# Patient Record
Sex: Male | Born: 1970 | Hispanic: Yes | Smoking: Former smoker
Health system: Southern US, Community
[De-identification: ages and names within clinical notes are randomized; demographics above are authoritative.]

## PROBLEM LIST (undated history)

## (undated) DIAGNOSIS — I10 Essential (primary) hypertension: Secondary | ICD-10-CM

## (undated) DIAGNOSIS — E119 Type 2 diabetes mellitus without complications: Secondary | ICD-10-CM

## (undated) HISTORY — PX: PEG TUBE REMOVAL: SHX2187

---

## 2013-06-14 DIAGNOSIS — J439 Emphysema, unspecified: Secondary | ICD-10-CM | POA: Insufficient documentation

## 2013-06-14 DIAGNOSIS — F17200 Nicotine dependence, unspecified, uncomplicated: Secondary | ICD-10-CM | POA: Insufficient documentation

## 2013-06-15 DIAGNOSIS — F1410 Cocaine abuse, uncomplicated: Secondary | ICD-10-CM | POA: Insufficient documentation

## 2013-07-29 DIAGNOSIS — R109 Unspecified abdominal pain: Secondary | ICD-10-CM | POA: Insufficient documentation

## 2015-03-29 HISTORY — PX: TRACHEOSTOMY: SUR1362

## 2015-03-29 HISTORY — PX: PEG PLACEMENT: SHX5437

## 2017-11-18 ENCOUNTER — Encounter (HOSPITAL_BASED_OUTPATIENT_CLINIC_OR_DEPARTMENT_OTHER): Payer: Self-pay

## 2017-11-18 ENCOUNTER — Other Ambulatory Visit: Payer: Self-pay

## 2017-11-18 ENCOUNTER — Emergency Department (HOSPITAL_BASED_OUTPATIENT_CLINIC_OR_DEPARTMENT_OTHER): Payer: Self-pay

## 2017-11-18 ENCOUNTER — Inpatient Hospital Stay (HOSPITAL_BASED_OUTPATIENT_CLINIC_OR_DEPARTMENT_OTHER)
Admission: EM | Admit: 2017-11-18 | Discharge: 2017-11-23 | DRG: 418 | Disposition: A | Discharge status: Home / Self Care | Payer: Self-pay | Attending: Internal Medicine | Admitting: Internal Medicine

## 2017-11-18 DIAGNOSIS — N183 Chronic kidney disease, stage 3 unspecified: Secondary | ICD-10-CM

## 2017-11-18 DIAGNOSIS — R112 Nausea with vomiting, unspecified: Secondary | ICD-10-CM

## 2017-11-18 DIAGNOSIS — E1121 Type 2 diabetes mellitus with diabetic nephropathy: Secondary | ICD-10-CM | POA: Diagnosis present

## 2017-11-18 DIAGNOSIS — K3184 Gastroparesis: Secondary | ICD-10-CM

## 2017-11-18 DIAGNOSIS — K5909 Other constipation: Secondary | ICD-10-CM | POA: Diagnosis present

## 2017-11-18 DIAGNOSIS — Z419 Encounter for procedure for purposes other than remedying health state, unspecified: Secondary | ICD-10-CM

## 2017-11-18 DIAGNOSIS — Z23 Encounter for immunization: Secondary | ICD-10-CM

## 2017-11-18 DIAGNOSIS — Z87891 Personal history of nicotine dependence: Secondary | ICD-10-CM

## 2017-11-18 DIAGNOSIS — Z79899 Other long term (current) drug therapy: Secondary | ICD-10-CM

## 2017-11-18 DIAGNOSIS — K920 Hematemesis: Secondary | ICD-10-CM | POA: Diagnosis present

## 2017-11-18 DIAGNOSIS — E1122 Type 2 diabetes mellitus with diabetic chronic kidney disease: Secondary | ICD-10-CM | POA: Diagnosis present

## 2017-11-18 DIAGNOSIS — N179 Acute kidney failure, unspecified: Secondary | ICD-10-CM | POA: Diagnosis present

## 2017-11-18 DIAGNOSIS — I10 Essential (primary) hypertension: Secondary | ICD-10-CM

## 2017-11-18 DIAGNOSIS — Z789 Other specified health status: Secondary | ICD-10-CM

## 2017-11-18 DIAGNOSIS — E1129 Type 2 diabetes mellitus with other diabetic kidney complication: Secondary | ICD-10-CM | POA: Insufficient documentation

## 2017-11-18 DIAGNOSIS — Z91013 Allergy to seafood: Secondary | ICD-10-CM

## 2017-11-18 DIAGNOSIS — D649 Anemia, unspecified: Secondary | ICD-10-CM | POA: Diagnosis present

## 2017-11-18 DIAGNOSIS — I129 Hypertensive chronic kidney disease with stage 1 through stage 4 chronic kidney disease, or unspecified chronic kidney disease: Secondary | ICD-10-CM | POA: Diagnosis present

## 2017-11-18 DIAGNOSIS — K811 Chronic cholecystitis: Principal | ICD-10-CM | POA: Diagnosis present

## 2017-11-18 DIAGNOSIS — E871 Hypo-osmolality and hyponatremia: Secondary | ICD-10-CM | POA: Diagnosis not present

## 2017-11-18 DIAGNOSIS — N200 Calculus of kidney: Secondary | ICD-10-CM | POA: Diagnosis present

## 2017-11-18 DIAGNOSIS — E1165 Type 2 diabetes mellitus with hyperglycemia: Secondary | ICD-10-CM | POA: Insufficient documentation

## 2017-11-18 DIAGNOSIS — K828 Other specified diseases of gallbladder: Secondary | ICD-10-CM | POA: Diagnosis present

## 2017-11-18 DIAGNOSIS — Z794 Long term (current) use of insulin: Secondary | ICD-10-CM

## 2017-11-18 DIAGNOSIS — E860 Dehydration: Secondary | ICD-10-CM | POA: Diagnosis present

## 2017-11-18 DIAGNOSIS — E1140 Type 2 diabetes mellitus with diabetic neuropathy, unspecified: Secondary | ICD-10-CM | POA: Diagnosis present

## 2017-11-18 DIAGNOSIS — K4090 Unilateral inguinal hernia, without obstruction or gangrene, not specified as recurrent: Secondary | ICD-10-CM | POA: Diagnosis present

## 2017-11-18 HISTORY — DX: Type 2 diabetes mellitus without complications: E11.9

## 2017-11-18 HISTORY — DX: Essential (primary) hypertension: I10

## 2017-11-18 LAB — COMPREHENSIVE METABOLIC PANEL
ALK PHOS: 51 U/L (ref 38–126)
ALT: 16 U/L (ref 0–44)
AST: 22 U/L (ref 15–41)
Albumin: 4.1 g/dL (ref 3.5–5.0)
Anion gap: 13 (ref 5–15)
BUN: 33 mg/dL — AB (ref 6–20)
CALCIUM: 9.1 mg/dL (ref 8.9–10.3)
CHLORIDE: 99 mmol/L (ref 98–111)
CO2: 24 mmol/L (ref 22–32)
CREATININE: 2.41 mg/dL — AB (ref 0.61–1.24)
GFR calc Af Amer: 35 mL/min — ABNORMAL LOW (ref >60)
GFR calc non Af Amer: 31 mL/min — ABNORMAL LOW (ref >60)
GLUCOSE: 289 mg/dL — AB (ref 70–99)
Potassium: 4 mmol/L (ref 3.5–5.1)
SODIUM: 136 mmol/L (ref 135–145)
Total Bilirubin: 1.1 mg/dL (ref 0.3–1.2)
Total Protein: 7.2 g/dL (ref 6.5–8.1)

## 2017-11-18 LAB — CBG MONITORING, ED: Glucose-Capillary: 294 mg/dL — ABNORMAL HIGH (ref 70–99)

## 2017-11-18 LAB — CBC
HEMATOCRIT: 35.6 % — AB (ref 39.0–52.0)
Hemoglobin: 11.8 g/dL — ABNORMAL LOW (ref 13.0–17.0)
MCH: 29.5 pg (ref 26.0–34.0)
MCHC: 33.1 g/dL (ref 30.0–36.0)
MCV: 89 fL (ref 78.0–100.0)
Platelets: 236 10*3/uL (ref 150–400)
RBC: 4 MIL/uL — ABNORMAL LOW (ref 4.22–5.81)
RDW: 13.6 % (ref 11.5–15.5)
WBC: 8 10*3/uL (ref 4.0–10.5)

## 2017-11-18 LAB — GLUCOSE, CAPILLARY
GLUCOSE-CAPILLARY: 266 mg/dL — AB (ref 70–99)
Glucose-Capillary: 168 mg/dL — ABNORMAL HIGH (ref 70–99)

## 2017-11-18 LAB — CBC WITH DIFFERENTIAL/PLATELET
BASOS ABS: 0.1 10*3/uL (ref 0.0–0.1)
Basophils Relative: 1 %
EOS ABS: 0.2 10*3/uL (ref 0.0–0.7)
Eosinophils Relative: 3 %
HCT: 39.2 % (ref 39.0–52.0)
HEMOGLOBIN: 13.3 g/dL (ref 13.0–17.0)
LYMPHS PCT: 27 %
Lymphs Abs: 2 10*3/uL (ref 0.7–4.0)
MCH: 29.6 pg (ref 26.0–34.0)
MCHC: 33.9 g/dL (ref 30.0–36.0)
MCV: 87.1 fL (ref 78.0–100.0)
Monocytes Absolute: 0.7 10*3/uL (ref 0.1–1.0)
Monocytes Relative: 9 %
NEUTROS PCT: 60 %
Neutro Abs: 4.4 10*3/uL (ref 1.7–7.7)
PLATELETS: 228 10*3/uL (ref 150–400)
RBC: 4.5 MIL/uL (ref 4.22–5.81)
RDW: 13.3 % (ref 11.5–15.5)
WBC: 7.2 10*3/uL (ref 4.0–10.5)

## 2017-11-18 LAB — TROPONIN I: Troponin I: <0.03 ng/mL (ref <0.03)

## 2017-11-18 LAB — LIPASE, BLOOD: Lipase: 48 U/L (ref 11–51)

## 2017-11-18 LAB — PROTIME-INR
INR: 1.15
Prothrombin Time: 14.6 seconds (ref 11.4–15.2)

## 2017-11-18 MED ORDER — ACETAMINOPHEN 650 MG RE SUPP: 650.0000 mg | Freq: Four times a day (QID) | RECTAL | Status: DC | PRN

## 2017-11-18 MED ORDER — FAMOTIDINE IN NACL 20-0.9 MG/50ML-% IV SOLN
20.0000 mg | Freq: Once | INTRAVENOUS | Status: AC
  Administered 2017-11-18: 20 mg via INTRAVENOUS
  Filled 2017-11-18: qty 50

## 2017-11-18 MED ORDER — LABETALOL HCL 5 MG/ML IV SOLN
10.0000 mg | Freq: Once | INTRAVENOUS | Status: AC
  Administered 2017-11-18: 10 mg via INTRAVENOUS
  Filled 2017-11-18: qty 4

## 2017-11-18 MED ORDER — ACETAMINOPHEN 325 MG PO TABS
650.0000 mg | ORAL_TABLET | Freq: Four times a day (QID) | ORAL | Status: DC | PRN
  Administered 2017-11-20 – 2017-11-22 (×4): 650 mg via ORAL
  Filled 2017-11-18 (×4): qty 2

## 2017-11-18 MED ORDER — METOCLOPRAMIDE HCL 5 MG/ML IJ SOLN
10.0000 mg | Freq: Once | INTRAMUSCULAR | Status: AC
  Administered 2017-11-18: 10 mg via INTRAVENOUS
  Filled 2017-11-18: qty 2

## 2017-11-18 MED ORDER — SODIUM CHLORIDE 0.9 % IV BOLUS
1000.0000 mL | Freq: Once | INTRAVENOUS | Status: AC
  Administered 2017-11-18: 1000 mL via INTRAVENOUS

## 2017-11-18 MED ORDER — ONDANSETRON HCL 4 MG PO TABS
4.0000 mg | ORAL_TABLET | Freq: Four times a day (QID) | ORAL | Status: DC
  Administered 2017-11-21 – 2017-11-23 (×5): 4 mg via ORAL
  Filled 2017-11-18 (×7): qty 1

## 2017-11-18 MED ORDER — PNEUMOCOCCAL VAC POLYVALENT 25 MCG/0.5ML IJ INJ
0.5000 mL | INJECTION | INTRAMUSCULAR | Status: AC
  Administered 2017-11-19: 0.5 mL via INTRAMUSCULAR
  Filled 2017-11-18: qty 0.5

## 2017-11-18 MED ORDER — INSULIN GLARGINE 100 UNIT/ML ~~LOC~~ SOLN
8.0000 [IU] | Freq: Every day | SUBCUTANEOUS | Status: DC
  Administered 2017-11-18 – 2017-11-21 (×4): 8 [IU] via SUBCUTANEOUS
  Filled 2017-11-18 (×5): qty 0.08

## 2017-11-18 MED ORDER — PROMETHAZINE HCL 25 MG/ML IJ SOLN
25.0000 mg | Freq: Once | INTRAMUSCULAR | Status: AC
  Administered 2017-11-18: 25 mg via INTRAVENOUS
  Filled 2017-11-18: qty 1

## 2017-11-18 MED ORDER — LACTATED RINGERS IV SOLN
INTRAVENOUS | Status: DC
  Administered 2017-11-18 (×2): via INTRAVENOUS

## 2017-11-18 MED ORDER — ONDANSETRON HCL 4 MG/2ML IJ SOLN: 4.0000 mg | Freq: Four times a day (QID) | INTRAMUSCULAR | Status: DC | PRN

## 2017-11-18 MED ORDER — HALOPERIDOL LACTATE 5 MG/ML IJ SOLN: 2.5000 mg | Freq: Once | INTRAMUSCULAR | Status: DC

## 2017-11-18 MED ORDER — DIPHENHYDRAMINE HCL 50 MG/ML IJ SOLN
25.0000 mg | Freq: Once | INTRAMUSCULAR | Status: AC
  Administered 2017-11-18: 25 mg via INTRAVENOUS
  Filled 2017-11-18: qty 1

## 2017-11-18 MED ORDER — HALOPERIDOL LACTATE 5 MG/ML IJ SOLN: 5.0000 mg | Freq: Once | INTRAMUSCULAR | Status: DC

## 2017-11-18 MED ORDER — INSULIN ASPART 100 UNIT/ML ~~LOC~~ SOLN
0.0000 [IU] | Freq: Every day | SUBCUTANEOUS | Status: DC
  Administered 2017-11-21 – 2017-11-22 (×2): 3 [IU] via SUBCUTANEOUS

## 2017-11-18 MED ORDER — SODIUM CHLORIDE 0.9 % IV SOLN
INTRAVENOUS | Status: DC
  Administered 2017-11-18 – 2017-11-20 (×7): via INTRAVENOUS

## 2017-11-18 MED ORDER — HYDROMORPHONE HCL 1 MG/ML IJ SOLN
1.0000 mg | Freq: Once | INTRAMUSCULAR | Status: AC
  Administered 2017-11-18: 1 mg via INTRAVENOUS
  Filled 2017-11-18: qty 1

## 2017-11-18 MED ORDER — ONDANSETRON HCL 4 MG/2ML IJ SOLN
4.0000 mg | Freq: Once | INTRAMUSCULAR | Status: AC
  Administered 2017-11-18: 4 mg via INTRAVENOUS
  Filled 2017-11-18: qty 2

## 2017-11-18 MED ORDER — INSULIN ASPART 100 UNIT/ML ~~LOC~~ SOLN
0.0000 [IU] | Freq: Three times a day (TID) | SUBCUTANEOUS | Status: DC
  Administered 2017-11-18: 5 [IU] via SUBCUTANEOUS
  Administered 2017-11-19: 2 [IU] via SUBCUTANEOUS
  Administered 2017-11-19: 1 [IU] via SUBCUTANEOUS
  Administered 2017-11-20: 2 [IU] via SUBCUTANEOUS
  Administered 2017-11-20 – 2017-11-21 (×2): 1 [IU] via SUBCUTANEOUS
  Administered 2017-11-21: 3 [IU] via SUBCUTANEOUS
  Administered 2017-11-22: 2 [IU] via SUBCUTANEOUS
  Administered 2017-11-22: 1 [IU] via SUBCUTANEOUS
  Administered 2017-11-23: 3 [IU] via SUBCUTANEOUS

## 2017-11-18 MED ORDER — ONDANSETRON HCL 4 MG/2ML IJ SOLN
4.0000 mg | Freq: Four times a day (QID) | INTRAMUSCULAR | Status: DC
  Administered 2017-11-18 – 2017-11-22 (×11): 4 mg via INTRAVENOUS
  Filled 2017-11-18 (×13): qty 2

## 2017-11-18 MED ORDER — METOCLOPRAMIDE HCL 5 MG/ML IJ SOLN
10.0000 mg | Freq: Four times a day (QID) | INTRAMUSCULAR | Status: DC
  Administered 2017-11-18 – 2017-11-23 (×17): 10 mg via INTRAVENOUS
  Filled 2017-11-18 (×18): qty 2

## 2017-11-18 MED ORDER — MORPHINE SULFATE (PF) 2 MG/ML IV SOLN
2.0000 mg | INTRAVENOUS | Status: DC | PRN
  Administered 2017-11-19 – 2017-11-22 (×13): 2 mg via INTRAVENOUS
  Filled 2017-11-18 (×13): qty 1

## 2017-11-18 MED ORDER — PANTOPRAZOLE SODIUM 40 MG IV SOLR
40.0000 mg | Freq: Two times a day (BID) | INTRAVENOUS | Status: DC
  Administered 2017-11-18 – 2017-11-23 (×11): 40 mg via INTRAVENOUS
  Filled 2017-11-18 (×13): qty 40

## 2017-11-18 MED ORDER — LABETALOL HCL 5 MG/ML IV SOLN
10.0000 mg | INTRAVENOUS | Status: DC | PRN
  Administered 2017-11-19: 10 mg via INTRAVENOUS
  Filled 2017-11-18 (×2): qty 4

## 2017-11-18 MED ORDER — ONDANSETRON HCL 4 MG PO TABS: 4.0000 mg | ORAL_TABLET | Freq: Four times a day (QID) | ORAL | Status: DC | PRN

## 2017-11-18 MED ORDER — HALOPERIDOL LACTATE 5 MG/ML IJ SOLN
2.5000 mg | Freq: Once | INTRAMUSCULAR | Status: AC
  Administered 2017-11-18: 2.5 mg via INTRAVENOUS
  Filled 2017-11-18: qty 1

## 2017-11-18 NOTE — H&P (Addendum)
History and Physical    Thomas SlocumbWellington Haley  ZOX:[id number]  DOB: 11-24-1970  DOA: 11/18/2017 PCP: Patient, No Pcp Per   Patient coming from: home  Chief Complaint: vomiting  HPI: Thomas SlocumbWellington Blasdel is a 47 y.o. male with medical history of DM for the past 5 yrs, HTN who presents to the ED for vomiting and abdominal pain. 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  He was admitted for vomiting and abdominal pain at [facility] overnight and discharged yesterday. He states he was told his vomiting may be from his diabetes and was discharged with some medications that he has not yet had a chance to pick up. He has had ongoing vomiting and comes back to the ED. He states that there is a large amount of blood in his vomitus as well. He last vomited around 8 AM. He has pain in his mid abdomen which is a 7/10 right now and does not radiate. Pain medictaions make it better. He has not had any diarrhea. Stool has been brown and not bloody. No fever or chills. He states that he has had these symptoms for about 1 wk now and prior to this, he has never had any GI symptoms or weight loss. He does not drink alcohol.  He takes his insulin apprpriately and his diabetes "numbers" were about 7.5 when checked 3 months ago.   ED Course: given Reglan, Haldol, Labetalol, Dilaudid, Zofran, Pepcid and Phenergan. Also given NS boluses. Symptoms did not improve and he was referred for admission.   Review of Systems:  Admits to pain like "cramping" in his feet almost all the time. All other systems reviewed and apart from HPI, are negative.  Past Medical History:  Diagnosis Date  . Diabetes mellitus without complication (HCC)   . Hypertension     History reviewed. No pertinent surgical history.  Social History:  He does not admit to any tobacco, alcohol or drug use  Allergies  Allergen Reactions  . Shellfish Allergy    Family history- Grandfather  and children have Diabetes   Prior to Admission medications   Not on File    Physical Exam: Wt Readings from Last 3 Encounters:  No data found for Wt   Vitals:   11/18/17 1106 11/18/17 1107 11/18/17 1130 11/18/17 1238  BP: (!) 156/93  (!) 173/83 (!) 151/97  Pulse:  (!) 105 (!) 105 (!) 106  Resp:   18 16  Temp:    99.2 F (37.3 C)  TempSrc:    Oral  SpO2:  95% 97% 100%      Constitutional:  Calm & comfortable Eyes: PERRLA, lids and conjunctivae normal ENT:  Mucous membranes are moist.  Pharynx clear of exudate   Normal dentition.  Neck: Supple, no masses  Respiratory:  Clear to auscultation bilaterally  Normal respiratory effort.  Cardiovascular:  S1 & S2 heard, regular rate and rhythm No Murmurs Abdomen:  Non distended Tenderness in mid abdomen, No masses Bowel sounds normal Extremities:  No clubbing / cyanosis No pedal edema No joint deformity    Skin:  No rashes, lesions or ulcers Neurologic:  AAO x 3 CN 2-12 grossly intact Sensation intact Strength 5/5 in all 4 extremities Psychiatric:  Normal Mood and affect    Labs on Admission: I have personally reviewed following labs and imaging studies  CBC: Recent Labs  Lab 11/18/17 0553  WBC 7.2  NEUTROABS 4.4  HGB 13.3  HCT 39.2  MCV 87.1  PLT 228   Basic Metabolic Panel: Recent Labs  Lab 11/18/17 0553  NA 136  K 4.0  CL 99  CO2 24  GLUCOSE 289*  BUN 33*  CREATININE 2.41*  CALCIUM 9.1   GFR: CrCl cannot be calculated (Unknown ideal weight.). Liver Function Tests: Recent Labs  Lab 11/18/17 0553  AST 22  ALT 16  ALKPHOS 51  BILITOT 1.1  PROT 7.2  ALBUMIN 4.1   Recent Labs  Lab 11/18/17 0553  LIPASE 48   No results for input(s): AMMONIA in the last 168 hours. Coagulation Profile: Recent Labs  Lab 11/18/17 0553  INR 1.15   Cardiac Enzymes: Recent Labs  Lab 11/18/17 0553  TROPONINI <0.03   BNP (last 3 results) No results for input(s): PROBNP in the last 8760  hours. HbA1C: No results for input(s): HGBA1C in the last 72 hours. CBG: Recent Labs  Lab 11/18/17 0550  GLUCAP 294*   Lipid Profile: No results for input(s): CHOL, HDL, LDLCALC, TRIG, CHOLHDL, LDLDIRECT in the last 72 hours. Thyroid Function Tests: No results for input(s): TSH, T4TOTAL, FREET4, T3FREE, THYROIDAB in the last 72 hours. Anemia Panel: No results for input(s): VITAMINB12, FOLATE, FERRITIN, TIBC, IRON, RETICCTPCT in the last 72 hours. Urine analysis: No results found for: COLORURINE, APPEARANCEUR, LABSPEC, PHURINE, GLUCOSEU, HGBUR, BILIRUBINUR, KETONESUR, PROTEINUR, UROBILINOGEN, NITRITE, LEUKOCYTESUR Sepsis Labs: @LABRCNTIP (procalcitonin:4,lacticidven:4) )No results found for this or any previous visit (from the past 240 hour(s)).   Radiological Exams on Admission: Ct Abdomen Pelvis Wo Contrast  Result Date: 11/18/2017 CLINICAL DATA:  Acute epigastric abdominal pain. EXAM: CT ABDOMEN AND PELVIS WITHOUT CONTRAST TECHNIQUE: Multidetector CT imaging of the abdomen and pelvis was performed following the standard protocol without IV contrast. COMPARISON:  None. FINDINGS: Lower chest: No acute abnormality. Hepatobiliary: No focal liver abnormality is seen. No gallstones, gallbladder wall thickening, or biliary dilatation. Pancreas: Unremarkable. No pancreatic ductal dilatation or surrounding inflammatory changes. Spleen: Normal in size without focal abnormality. Adrenals/Urinary Tract: Adrenal glands appear normal. Bilateral nonobstructive nephrolithiasis is noted. Largest calculus measures 7 mm in lower pole collecting system of right kidney. No hydronephrosis or renal obstruction is noted. Mild urinary bladder distention is noted. Stomach/Bowel: Stomach is within normal limits. Appendix appears normal. No evidence of bowel wall thickening, distention, or inflammatory changes. Diverticulosis of descending and sigmoid colon is noted without inflammation. Vascular/Lymphatic: No  significant vascular findings are present. No enlarged abdominal or pelvic lymph nodes. Reproductive: Mild prostatic enlargement is noted. Other: No abdominal wall hernia or abnormality. No abdominopelvic ascites. Musculoskeletal: No acute or significant osseous findings. IMPRESSION: Bilateral nonobstructive nephrolithiasis. No hydronephrosis or renal obstruction is noted. Diverticulosis of descending and sigmoid colon is noted without inflammation. Mild prostatic enlargement. Electronically Signed   By: Lupita Raider, M.D.   On: 11/18/2017 08:59   Dg Abdomen Acute W/chest  Result Date: 11/18/2017 CLINICAL DATA:  Abdominal pain, nausea and vomiting. Shortness of breath. EXAM: DG ABDOMEN ACUTE W/ 1V CHEST COMPARISON:  None. FINDINGS: The cardiomediastinal contours are normal. The lungs are hyperinflated but clear. There is no free intra-abdominal air. No dilated bowel loops to suggest obstruction. Slight increased air throughout normal caliber small bowel in the right abdomen. Small volume of stool throughout the colon. No radiopaque calculi. No acute osseous abnormalities are seen. IMPRESSION: 1. Slight increased air within normal caliber small bowel loops in the right abdomen may represent enteritis or ileus. No obstruction or free air. 2. Hyperinflated but clear lungs. Electronically Signed  By: Rubye Oaks M.D.   On: 11/18/2017 06:48    EKG: Independently reviewed. Sinus rhythm at 109 bpm  Assessment/Plan Principal Problem:   Hematemesis / abdominal pain - he states his symptoms started about 1 wk ago - upon reviewing notes from [facility], he had a CT scan on 8/22  without contrast which did reveal nephrolithiasis but no obstruction and no other issues.  - he underwent a repeat CT today without contrast which does not reveal any etiology for his symptoms - will ask for GI eval due to his complaint of hemetemesis- It appears they may have consulted GI at [facility] but I  do not see an EGD report. - start Protonix and antiemetics - if he did have an A1c which was 7, I should not expect him to have gastroparesis but will repeat A1c and consider gastroparesis in his differential - NPO except ice chips and necessary medications - cont IVF - follow Hb- currently not anemic  Active Problems:   DM (diabetes mellitus), type 2 with renal complications - he tells me he takes 15 U of Lantus QHS and 5 U of Novolog TID/AC - resume Lantus at lower dose and add Novolog SSI - check A1c- he believes his last one was around 7 - ? If pain in his feet is neuropathy- follow  CKD 3? - labs from [facility] show a similar Cr level- follow Cr   Nephrolithiasis without obstruction    HTN (hypertension)  - will use Labetalol for now   DVT prophylaxis: SCDs Code Status: Full code  Family Communication:   Disposition Plan: admit to med/surg  Consults called: GI  Admission status: inpatient    Calvert Cantor MD [facility] Pager: [email or web address] Password [email or web address] 7PM-7AM, please contact night-coverage   11/18/2017, 1:29 PM

## 2017-11-18 NOTE — ED Notes (Signed)
Pt ambulated to restroom. Requesting to speak with MD. Will make MD aware.

## 2017-11-18 NOTE — ED Notes (Signed)
Patient transported to X-ray 

## 2017-11-18 NOTE — ED Notes (Signed)
Pt sleeping at this time. NAD noted. Awaiting disposition.

## 2017-11-18 NOTE — ED Notes (Signed)
Pt dry heaving  

## 2017-11-18 NOTE — ED Notes (Signed)
Pt given PO fluids.

## 2017-11-18 NOTE — Progress Notes (Signed)
Pt. arrived to [facility] from [facility]. Alert and oriented x 4. No respiratory distress noted.

## 2017-11-18 NOTE — ED Notes (Signed)
MD at bedside. 

## 2017-11-18 NOTE — ED Provider Notes (Addendum)
[facility] EMERGENCY DEPARTMENT Provider Note   CSN: [id number] Arrival date & time: 11/18/17  0540     History   Chief Complaint Chief Complaint  Patient presents with  . Emesis    HPI Thomas Haley is a 47 y.o. male.  HPI   47 yo M with reported h/o Dm here with abdominal pain. Pt in distress, yelling on arrival but per his report w/ interpreter, he has a h/o Dm and recurrent abd pain. He states that he's been told his pain is from "his diabetes." He states that for 2-3 days he's had severe epigastric pain that is aching, gnawing, and worse with eating. He's had associated vomiting and nausea. Unable to eat or drink. Denies any alcohol or other drug use, particularly marijuana. No fevers. No diarrhea or constipation. His current pain feels similar to his recurrent pain. He does not follow regularly with a GI doctor.  Past Medical History:  Diagnosis Date  . Hypertension     DM, type 1 HTN UGIB at [facility] per records   There are no active problems to display for this patient.  No relevant abdominal surgeries        Home Medications    Prior to Admission medications   Not on File    Family History History reviewed. No pertinent family history.  Social History Social History   Tobacco Use  . Smoking status: Not on file  Substance Use Topics  . Alcohol use: Not on file  . Drug use: Not on file     Allergies   Shellfish allergy   Review of Systems Review of Systems  Constitutional: Positive for fatigue. Negative for chills and fever.  HENT: Negative for congestion and rhinorrhea.   Eyes: Negative for visual disturbance.  Respiratory: Negative for cough, shortness of breath and wheezing.   Cardiovascular: Negative for chest pain and leg swelling.  Gastrointestinal: Positive for abdominal pain, nausea and vomiting. Negative for diarrhea.  Genitourinary: Negative for dysuria and flank pain.  Musculoskeletal: Negative for neck pain  and neck stiffness.  Skin: Negative for rash and wound.  Allergic/Immunologic: Negative for immunocompromised state.  Neurological: Positive for weakness. Negative for syncope and headaches.  All other systems reviewed and are negative.    Physical Exam Updated Vital Signs BP (!) 188/120 (BP Location: Right Arm)   Pulse (!) 103   Temp 98.1 F (36.7 C) (Oral)   Resp 19   SpO2 98%   Physical Exam  Constitutional: He is oriented to person, place, and time. He appears well-developed and well-nourished. He appears distressed.  HENT:  Head: Normocephalic and atraumatic.  Dry MM  Eyes: Conjunctivae are normal.  Neck: Neck supple.  Cardiovascular: Regular rhythm and normal heart sounds. Tachycardia present. Exam reveals no friction rub.  No murmur heard. Pulmonary/Chest: Effort normal and breath sounds normal. No respiratory distress. He has no wheezes. He has no rales.  Abdominal: Soft. He exhibits no distension. There is tenderness (epigastric). There is guarding. There is no rebound.  Musculoskeletal: He exhibits no edema.  Neurological: He is alert and oriented to person, place, and time. He exhibits normal muscle tone.  Skin: Skin is warm. Capillary refill takes less than 2 seconds. He is diaphoretic.  Psychiatric: He has a normal mood and affect.  Nursing note and vitals reviewed.    ED Treatments / Results  Labs (all labs ordered are listed, but only abnormal results are displayed) Labs Reviewed  COMPREHENSIVE METABOLIC PANEL - Abnormal; Notable  for the following components:      Result Value   Glucose, Bld 289 (*)    BUN 33 (*)    Creatinine, Ser 2.41 (*)    GFR calc non Af Amer 31 (*)    GFR calc Af Amer 35 (*)    All other components within normal limits  CBG MONITORING, ED - Abnormal; Notable for the following components:   Glucose-Capillary 294 (*)    All other components within normal limits  CBC WITH DIFFERENTIAL/PLATELET  LIPASE, BLOOD  TROPONIN I    PROTIME-INR    EKG EKG Interpretation  Date/Time:  Saturday November 18 2017 06:08:59 EDT Ventricular Rate:  109 PR Interval:    QRS Duration: 57 QT Interval:  334 QTC Calculation: 450 R Axis:   83 Text Interpretation:  Sinus tachycardia Artifact in lead(s) I II III aVR aVL aVF V1 V2 V3 V4 V5 V6 No old tracing to compare Confirmed by Shaune PollackIsaacs, Kedrick Mcnamee [phone]([id number]) on 11/18/2017 6:17:21 AM   Radiology Dg Abdomen Acute W/chest  Result Date: 11/18/2017 CLINICAL DATA:  Abdominal pain, nausea and vomiting. Shortness of breath. EXAM: DG ABDOMEN ACUTE W/ 1V CHEST COMPARISON:  None. FINDINGS: The cardiomediastinal contours are normal. The lungs are hyperinflated but clear. There is no free intra-abdominal air. No dilated bowel loops to suggest obstruction. Slight increased air throughout normal caliber small bowel in the right abdomen. Small volume of stool throughout the colon. No radiopaque calculi. No acute osseous abnormalities are seen. IMPRESSION: 1. Slight increased air within normal caliber small bowel loops in the right abdomen may represent enteritis or ileus. No obstruction or free air. 2. Hyperinflated but clear lungs. Electronically Signed   By: Rubye OaksMelanie  Ehinger M.D.   On: 11/18/2017 06:48    Procedures Procedures (including critical care time)  Medications Ordered in ED Medications  sodium chloride 0.9 % bolus 1,000 mL (1,000 mLs Intravenous New Bag/Given 11/18/17 0554)  famotidine (PEPCID) IVPB 20 mg premix (20 mg Intravenous New Bag/Given 11/18/17 0630)  sodium chloride 0.9 % bolus 1,000 mL (1,000 mLs Intravenous New Bag/Given 11/18/17 0639)  labetalol (NORMODYNE,TRANDATE) injection 10 mg (has no administration in time range)  haloperidol lactate (HALDOL) injection 2.5 mg (2.5 mg Intravenous Given 11/18/17 0552)  diphenhydrAMINE (BENADRYL) injection 25 mg (25 mg Intravenous Given 11/18/17 0552)  metoCLOPramide (REGLAN) injection 10 mg (10 mg Intravenous Given 11/18/17 [id number])     Initial  Impression / Assessment and Plan / ED Course  I have reviewed the triage vital signs and the nursing notes.  Pertinent labs & imaging results that were available during my care of the patient were reviewed by me and considered in my medical decision making (see chart for details).     47 yo M with history of T1DM here with severe nausea, vomiting, and abdominal pain. Pt was reportedly just hospitalized at [facility] for similar sx, and pt endorses h/o same which is suspicious for diabetic gastroparesis. Denies MJ use. Will give antiemetics, fluids, and check labs. AAS ordered for eval of obstruction, perforation.  Labs reviewed. Hgb 13.3, which is reassuring given reported h/o UGIB at [facility]. WBC normal. CMP shows Cr 2.41. BUN elevated at 33, so I suspect there is a component of pre-renal AKI though baseline is unknown - will follow-up with [facility]. Lipase wnl. EKG w/ artifact but no ischemia, QTc 450. Will continue fluids, sx control. Pt is supposed to be on metoprolol as well and has been vomiting - will give dose of labetalol here.  AAS w/ enteritis/ileitis. Given degree of pain on serial exam, will check CT. Current plan is to continue fluids, sx control, and f/u [facility] lab work. If Cr is near baseline, and pt improved, would be reasonable to continue outpt care. If Cr elevated, persistently vomiting, CT is abnormal, or other concerning progression, dispo accordingly.   Patient care transferred to Dr. Hedwig Morton at the end of my shift. Patient presentation, ED course, and plan of care discussed with review of all pertinent labs and imaging. Please see his/her note for further details regarding further ED course and disposition.  Final Clinical Impressions(s) / ED Diagnoses   Final diagnoses:  Gastroparesis  Nausea and vomiting, intractability of vomiting not specified, unspecified vomiting type    ED Discharge Orders    None       Shaune Pollack, MD 11/18/17 1610    Shaune Pollack,  MD 11/18/17 [phone]

## 2017-11-18 NOTE — ED Provider Notes (Signed)
7:44 AM Care transferred to me. Still with pain and vomiting. Will give more meds. CT pending. OSH records show Cr 2.65 on 11/17/17 at [facility] (stable CKD here)    9:55 AM CT unremarkable. Still vomiting despite haldol, reglan, zofran. Will continue fluids, try phenergan, and admit to for intractable vomiting  10:17 AM Dr. Butler Denmarkizwan [facility]   Pricilla LovelessGoldston, Kimberlly Norgard, MD 11/18/17 1017

## 2017-11-18 NOTE — ED Notes (Signed)
Pt yelling out from room, c/o increased pain. Will make MD aware.

## 2017-11-18 NOTE — ED Notes (Signed)
Water given  

## 2017-11-18 NOTE — ED Triage Notes (Signed)
Spanish speaking only pt. BIB EMS, w/ c/o abd pain and n/v. Interpreter utilized for triage.

## 2017-11-19 LAB — BASIC METABOLIC PANEL
ANION GAP: 11 (ref 5–15)
BUN: 24 mg/dL — ABNORMAL HIGH (ref 6–20)
CO2: 25 mmol/L (ref 22–32)
Calcium: 8.4 mg/dL — ABNORMAL LOW (ref 8.9–10.3)
Chloride: 105 mmol/L (ref 98–111)
Creatinine, Ser: 2.06 mg/dL — ABNORMAL HIGH (ref 0.61–1.24)
GFR calc Af Amer: 43 mL/min — ABNORMAL LOW (ref >60)
GFR, EST NON AFRICAN AMERICAN: 37 mL/min — AB (ref >60)
GLUCOSE: 189 mg/dL — AB (ref 70–99)
POTASSIUM: 4 mmol/L (ref 3.5–5.1)
Sodium: 141 mmol/L (ref 135–145)

## 2017-11-19 LAB — GLUCOSE, CAPILLARY
GLUCOSE-CAPILLARY: 148 mg/dL — AB (ref 70–99)
GLUCOSE-CAPILLARY: 87 mg/dL (ref 70–99)
Glucose-Capillary: 172 mg/dL — ABNORMAL HIGH (ref 70–99)
Glucose-Capillary: 191 mg/dL — ABNORMAL HIGH (ref 70–99)
Glucose-Capillary: 108 mg/dL — ABNORMAL HIGH (ref 70–99)

## 2017-11-19 LAB — CBC
HEMATOCRIT: 37 % — AB (ref 39.0–52.0)
HEMATOCRIT: 33.8 % — AB (ref 39.0–52.0)
HEMOGLOBIN: 12.3 g/dL — AB (ref 13.0–17.0)
Hemoglobin: 11.3 g/dL — ABNORMAL LOW (ref 13.0–17.0)
MCH: 29.1 pg (ref 26.0–34.0)
MCH: 29.2 pg (ref 26.0–34.0)
MCHC: 33.2 g/dL (ref 30.0–36.0)
MCHC: 33.4 g/dL (ref 30.0–36.0)
MCV: 87.5 fL (ref 78.0–100.0)
MCV: 87.3 fL (ref 78.0–100.0)
PLATELETS: 213 10*3/uL (ref 150–400)
Platelets: 221 10*3/uL (ref 150–400)
RBC: 4.23 MIL/uL (ref 4.22–5.81)
RBC: 3.87 MIL/uL — ABNORMAL LOW (ref 4.22–5.81)
RDW: 13.5 % (ref 11.5–15.5)
RDW: 13.5 % (ref 11.5–15.5)
WBC: 8.9 10*3/uL (ref 4.0–10.5)
WBC: 8.3 10*3/uL (ref 4.0–10.5)

## 2017-11-19 MED ORDER — PROMETHAZINE HCL 25 MG/ML IJ SOLN
12.5000 mg | Freq: Once | INTRAMUSCULAR | Status: AC
  Administered 2017-11-19: 12.5 mg via INTRAVENOUS
  Filled 2017-11-19: qty 1

## 2017-11-19 MED ORDER — ONDANSETRON HCL 4 MG/2ML IJ SOLN
4.0000 mg | Freq: Four times a day (QID) | INTRAMUSCULAR | Status: DC | PRN
  Administered 2017-11-19 – 2017-11-22 (×4): 4 mg via INTRAVENOUS
  Filled 2017-11-19 (×4): qty 2

## 2017-11-19 MED ORDER — HYDRALAZINE HCL 20 MG/ML IJ SOLN
10.0000 mg | Freq: Four times a day (QID) | INTRAMUSCULAR | Status: DC | PRN
  Administered 2017-11-19 – 2017-11-23 (×9): 10 mg via INTRAVENOUS
  Filled 2017-11-19 (×9): qty 1

## 2017-11-19 NOTE — Progress Notes (Signed)
PROGRESS NOTE    Thomas SlocumbWellington Haley  ZOX:[id number] DOB: 1971/02/17 DOA: 11/18/2017 PCP: Patient, No Pcp Per   Brief [name].Astersarrative47 y.o. male with medical history of DM for the past 5 yrs, HTN who presents to the ED for vomiting and abdominal pain. 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  He was admitted for vomiting and abdominal pain at [facility] overnight and discharged yesterday. He states he was told his vomiting may be from his diabetes and was discharged with some medications that he has not yet had a chance to pick up. He has had ongoing vomiting and comes back to the ED. He states that there is a large amount of blood in his vomitus as well. He last vomited around 8 AM. He has pain in his mid abdomen which is a 7/10 right now and does not radiate. Pain medictaions make it better. He has not had any diarrhea. Stool has been brown and not bloody. No fever or chills. He states that he has had these symptoms for about 1 wk now and prior to this, he has never had any GI symptoms or weight loss. He does not drink alcohol.  He takes his insulin apprpriately and his diabetes "numbers" were about 7.5 when checked 3 months ago.   ED Course: given Reglan, Haldol, Labetalol, Dilaudid, Zofran, Pepcid and Phenergan. Also given NS boluses. Symptoms did not improve and he was referred for admission.    Assessment & Plan:   Principal Problem:   Intractable vomiting with nausea Active Problems:   DM (diabetes mellitus), type 2 (HCC)   HTN (hypertension)   Hematemesis   Gastroparesis   Nausea and vomiting   Hematemesis / abdominal pain/intractable nausea and vomiting- - he states his symptoms started about 1 wk ago.  CT scan of the abdomen outside hospital showed nonobstructive kidney stones.  Continue Protonix and antiemetics - NPO except ice chips and necessary medications - cont IVF - follow Hb- currently not anemic -LFTs from  yesterday within normal limits.  Active Problems:   DM (diabetes mellitus), type 2 with renal complications - he tells me he takes 15 U of Lantus QHS and 5 U of Novolog TID/AC - resume Lantus at lower dose and add Novolog SSI -Check hemoglobin A1c   aki on CKD 3? Creatinine improving with IV fluids.  Today's creatinine is 2.06 down from 2.41.  Nephrolithiasis without obstruction    HTN (hypertension)  - will use Labetalol for now   DVT prophylaxis:scd Code Status: full Family Communication:none Disposition Plan:tbd Consultants: gi  Procedures:none Antimicrobials:none  Subjective: Complains of nausea vomiting and abdominal pain   Objective: Vitals:   11/18/17 1238 11/18/17 1500 11/18/17 2110 11/19/17 0406  BP: (!) 151/97  140/86 140/90  Pulse: (!) 106  83 96  Resp: 16  14 18   Temp: 99.2 F (37.3 C)  99 F (37.2 C) 99.2 F (37.3 C)  TempSrc: Oral  Oral Oral  SpO2: 100%  100% 98%  Weight:  65.3 kg    Height:  6' (1.829 m)      Intake/Output Summary (Last 24 hours) at 11/19/2017 1015 Last data filed at 11/19/2017 0915 Gross per 24 hour  Intake 2342.89 ml  Output 2675 ml  Net -332.11 ml   Filed Weights   11/18/17 1500  Weight: 65.3 kg    Examination:  General exam: Appears calm and comfortable  Respiratory system: Clear to auscultation. Respiratory  effort normal. Cardiovascular system: S1 & S2 heard, RRR. No JVD, murmurs, rubs, gallops or clicks. No pedal edema. Gastrointestinal system: Abdomen is nondistended, soft and tender. No organomegaly or masses felt. Normal bowel sounds heard. Central nervous system: Alert and oriented. No focal neurological deficits. Extremities: Symmetric 5 x 5 power. Skin: No rashes, lesions or ulcers Psychiatry: Judgement and insight appear normal. Mood & affect appropriate.     Data Reviewed: I have personally reviewed following labs and imaging studies  CBC: Recent Labs  Lab 11/18/17 0553 11/18/17 1818  11/19/17 0800  WBC 7.2 8.0 8.9  NEUTROABS 4.4  --   --   HGB 13.3 11.8* 12.3*  HCT 39.2 35.6* 37.0*  MCV 87.1 89.0 87.5  PLT 228 236 221   Basic Metabolic Panel: Recent Labs  Lab 11/18/17 0553 11/19/17 0800  NA 136 141  K 4.0 4.0  CL 99 105  CO2 24 25  GLUCOSE 289* 189*  BUN 33* 24*  CREATININE 2.41* 2.06*  CALCIUM 9.1 8.4*   GFR: Estimated Creatinine Clearance: 41.4 mL/min (A) (by C-G formula based on SCr of 2.06 mg/dL (H)). Liver Function Tests: Recent Labs  Lab 11/18/17 0553  AST 22  ALT 16  ALKPHOS 51  BILITOT 1.1  PROT 7.2  ALBUMIN 4.1   Recent Labs  Lab 11/18/17 0553  LIPASE 48   No results for input(s): AMMONIA in the last 168 hours. Coagulation Profile: Recent Labs  Lab 11/18/17 0553  INR 1.15   Cardiac Enzymes: Recent Labs  Lab 11/18/17 0553  TROPONINI <0.03   BNP (last 3 results) No results for input(s): PROBNP in the last 8760 hours. HbA1C: No results for input(s): HGBA1C in the last 72 hours. CBG: Recent Labs  Lab 11/18/17 0550 11/18/17 1728 11/18/17 2109 11/19/17 0634 11/19/17 0732  GLUCAP 294* 266* 168* 172* 191*   Lipid Profile: No results for input(s): CHOL, HDL, LDLCALC, TRIG, CHOLHDL, LDLDIRECT in the last 72 hours. Thyroid Function Tests: No results for input(s): TSH, T4TOTAL, FREET4, T3FREE, THYROIDAB in the last 72 hours. Anemia Panel: No results for input(s): VITAMINB12, FOLATE, FERRITIN, TIBC, IRON, RETICCTPCT in the last 72 hours. Sepsis Labs: No results for input(s): PROCALCITON, LATICACIDVEN in the last 168 hours.  No results found for this or any previous visit (from the past 240 hour(s)).       Radiology Studies: Ct Abdomen Pelvis Wo Contrast  Result Date: 11/18/2017 CLINICAL DATA:  Acute epigastric abdominal pain. EXAM: CT ABDOMEN AND PELVIS WITHOUT CONTRAST TECHNIQUE: Multidetector CT imaging of the abdomen and pelvis was performed following the standard protocol without IV contrast. COMPARISON:  None.  FINDINGS: Lower chest: No acute abnormality. Hepatobiliary: No focal liver abnormality is seen. No gallstones, gallbladder wall thickening, or biliary dilatation. Pancreas: Unremarkable. No pancreatic ductal dilatation or surrounding inflammatory changes. Spleen: Normal in size without focal abnormality. Adrenals/Urinary Tract: Adrenal glands appear normal. Bilateral nonobstructive nephrolithiasis is noted. Largest calculus measures 7 mm in lower pole collecting system of right kidney. No hydronephrosis or renal obstruction is noted. Mild urinary bladder distention is noted. Stomach/Bowel: Stomach is within normal limits. Appendix appears normal. No evidence of bowel wall thickening, distention, or inflammatory changes. Diverticulosis of descending and sigmoid colon is noted without inflammation. Vascular/Lymphatic: No significant vascular findings are present. No enlarged abdominal or pelvic lymph nodes. Reproductive: Mild prostatic enlargement is noted. Other: No abdominal wall hernia or abnormality. No abdominopelvic ascites. Musculoskeletal: No acute or significant osseous findings. IMPRESSION: Bilateral nonobstructive nephrolithiasis. No hydronephrosis or renal  obstruction is noted. Diverticulosis of descending and sigmoid colon is noted without inflammation. Mild prostatic enlargement. Electronically Signed   By: Lupita Raider, M.D.   On: 11/18/2017 08:59   Dg Abdomen Acute W/chest  Result Date: 11/18/2017 CLINICAL DATA:  Abdominal pain, nausea and vomiting. Shortness of breath. EXAM: DG ABDOMEN ACUTE W/ 1V CHEST COMPARISON:  None. FINDINGS: The cardiomediastinal contours are normal. The lungs are hyperinflated but clear. There is no free intra-abdominal air. No dilated bowel loops to suggest obstruction. Slight increased air throughout normal caliber small bowel in the right abdomen. Small volume of stool throughout the colon. No radiopaque calculi. No acute osseous abnormalities are seen. IMPRESSION:  1. Slight increased air within normal caliber small bowel loops in the right abdomen may represent enteritis or ileus. No obstruction or free air. 2. Hyperinflated but clear lungs. Electronically Signed   By: Rubye Oaks M.D.   On: 11/18/2017 06:48        Scheduled Meds: . insulin aspart  0-5 Units Subcutaneous QHS  . insulin aspart  0-9 Units Subcutaneous TID WC  . insulin glargine  8 Units Subcutaneous QHS  . metoCLOPramide (REGLAN) injection  10 mg Intravenous Q6H  . ondansetron  4 mg Oral QID   Or  . ondansetron (ZOFRAN) IV  4 mg Intravenous QID  . pantoprazole (PROTONIX) IV  40 mg Intravenous Q12H   Continuous Infusions: . sodium chloride 125 mL/hr at 11/19/17 1610     LOS: 1 day   Alwyn Ren, MD  If 7PM-7AM, please contact night-coverage [email or web address] Password [facility] 11/19/2017, 10:15 AM

## 2017-11-19 NOTE — Consult Note (Signed)
Referring Provider: [facility] Primary Care Physician:  Patient, No Pcp Per Primary Gastroenterologist:  unassigned  Reason for Consultation:  Coffee-ground emesis, intractable nausea and vomiting  HPI: Kiyoshi Schaab is a 47 y.o. male transfer to [facility] for further evaluation of nausea, vomiting and coffee-ground emesis. History obtained with the help of language interpreter ( [place]). Patient is complaining of generalized abdominal pain and nausea vomiting of 5 days' duration. He has been seeing coffee-ground material in the vomiting since last 3 days. He was initially admitted at [facility] where he was diagnosed with acute kidney injury and possible gastroparesis. According to patient, he had EGD 2-3 days ago which was negative. He denies any black tarry stool or bright red blood per rectum. Denied any diarrhea or constipation.  Patient with history of PEG tube and tracheostomy 3 years ago for respiratory failure/pneumonia. PEG tube has been removed at this time.  No family history of colon cancer.   [facility] [place] -records reviewed  I was not able to find EGD report, but there is mention of negative EGD in the discharge summary. There is also mention of GI consultation but I do not see consultation report.  [place] RUQ :  11/17/2017 - Nephrolithaisis,No acute billiary changes  CT abdomen without contrast showed no acute changes. HGB 11.5 08/23 , normal LFTs. Normal lipase.   Past Medical History:  Diagnosis Date  . Diabetes mellitus without complication (HCC)   . Hypertension     History reviewed. No pertinent surgical history.  Prior to Admission medications   Medication Sig Start Date End Date Taking? Authorizing Provider  diltiazem (CARDIZEM SR) 90 MG 12 hr capsule Take 90 mg by mouth 2 times daily at 12 noon and 4 pm. 10/22/17  Yes [provider]  ferrous sulfate 325 (65 FE) MG tablet Take 325 mg by mouth daily with  breakfast.   Yes [provider]  hydrochlorothiazide (MICROZIDE) 12.5 MG capsule Take 12.5 mg by mouth daily.   Yes [provider]    Scheduled Meds: . insulin aspart  0-5 Units Subcutaneous QHS  . insulin aspart  0-9 Units Subcutaneous TID WC  . insulin glargine  8 Units Subcutaneous QHS  . metoCLOPramide (REGLAN) injection  10 mg Intravenous Q6H  . ondansetron  4 mg Oral QID   Or  . ondansetron (ZOFRAN) IV  4 mg Intravenous QID  . pantoprazole (PROTONIX) IV  40 mg Intravenous Q12H  . pneumococcal 23 valent vaccine  0.5 mL Intramuscular Tomorrow-1000   Continuous Infusions: . sodium chloride 125 mL/hr at 11/19/17 0619   PRN Meds:.acetaminophen **OR** acetaminophen, labetalol, morphine injection, ondansetron (ZOFRAN) IV  Allergies as of 11/18/2017 - Review Complete 11/18/2017  Allergen Reaction Noted  . Shellfish allergy  11/18/2017    History reviewed. No pertinent family history.  Social History   Socioeconomic History  . Marital status: Not on file    Spouse name: Not on file  . Number of children: Not on file  . Years of education: Not on file  . Highest education level: Not on file  Occupational History  . Not on file  Social Needs  . Financial resource strain: Not on file  . Food insecurity:    Worry: Not on file    Inability: Not on file  . Transportation needs:    Medical: Not on file    Non-medical: Not on file  Tobacco Use  . Smoking status: Former Smoker    Last  attempt to quit: 11/19/2014    Years since quitting: 3.0  . Smokeless tobacco: Never Used  Substance and Sexual Activity  . Alcohol use: Not on file  . Drug use: Not on file  . Sexual activity: Not on file  Lifestyle  . Physical activity:    Days per week: Not on file    Minutes per session: Not on file  . Stress: Not on file  Relationships  . Social connections:    Talks on phone: Not on file    Gets together: Not on file    Attends religious service: Not on file     Active member of club or organization: Not on file    Attends meetings of clubs or organizations: Not on file    Relationship status: Not on file  . Intimate partner violence:    Fear of current or ex partner: Not on file    Emotionally abused: Not on file    Physically abused: Not on file    Forced sexual activity: Not on file  Other Topics Concern  . Not on file  Social History Narrative  . Not on file    Review of Systems: negative except as mentioned in history of present illness  Physical Exam:  Physical Exam  Constitutional: He is oriented to person, place, and time. He appears well-developed and well-nourished. He appears distressed.  Mild distress from nausea and vomiting  HENT:  Head: Normocephalic and atraumatic.  Eyes: EOM are normal.  Neck: Normal range of motion.  Cardiovascular: Normal rate, regular rhythm and normal heart sounds.  Pulmonary/Chest: Effort normal and breath sounds normal. No respiratory distress.  Abdominal: Soft. Bowel sounds are normal. He exhibits no distension. There is no tenderness. There is no rebound and no guarding.  Well-healed scar from previous PEG tube placement.  Musculoskeletal: Normal range of motion. He exhibits no edema.  Neurological: He is alert and oriented to person, place, and time.  Skin: Skin is warm. No erythema.  Psychiatric: He has a normal mood and affect. Judgment normal.   Vital signs: Vitals:   11/18/17 2110 11/19/17 0406  BP: 140/86 140/90  Pulse: 83 96  Resp: 14 18  Temp: 99 F (37.2 C) 99.2 F (37.3 C)  SpO2: 100% 98%   Last BM Date: 11/17/17   GI:  Lab Results: Recent Labs    11/18/17 0553 11/18/17 1818  WBC 7.2 8.0  HGB 13.3 11.8*  HCT 39.2 35.6*  PLT 228 236   BMET Recent Labs    11/18/17 0553  NA 136  K 4.0  CL 99  CO2 24  GLUCOSE 289*  BUN 33*  CREATININE 2.41*  CALCIUM 9.1   LFT Recent Labs    11/18/17 0553  PROT 7.2  ALBUMIN 4.1  AST 22  ALT 16  ALKPHOS 51  BILITOT  1.1   PT/INR Recent Labs    11/18/17 0553  LABPROT 14.6  INR 1.15     Studies/Results: Ct Abdomen Pelvis Wo Contrast  Result Date: 11/18/2017 CLINICAL DATA:  Acute epigastric abdominal pain. EXAM: CT ABDOMEN AND PELVIS WITHOUT CONTRAST TECHNIQUE: Multidetector CT imaging of the abdomen and pelvis was performed following the standard protocol without IV contrast. COMPARISON:  None. FINDINGS: Lower chest: No acute abnormality. Hepatobiliary: No focal liver abnormality is seen. No gallstones, gallbladder wall thickening, or biliary dilatation. Pancreas: Unremarkable. No pancreatic ductal dilatation or surrounding inflammatory changes. Spleen: Normal in size without focal abnormality. Adrenals/Urinary Tract: Adrenal glands appear normal. Bilateral  nonobstructive nephrolithiasis is noted. Largest calculus measures 7 mm in lower pole collecting system of right kidney. No hydronephrosis or renal obstruction is noted. Mild urinary bladder distention is noted. Stomach/Bowel: Stomach is within normal limits. Appendix appears normal. No evidence of bowel wall thickening, distention, or inflammatory changes. Diverticulosis of descending and sigmoid colon is noted without inflammation. Vascular/Lymphatic: No significant vascular findings are present. No enlarged abdominal or pelvic lymph nodes. Reproductive: Mild prostatic enlargement is noted. Other: No abdominal wall hernia or abnormality. No abdominopelvic ascites. Musculoskeletal: No acute or significant osseous findings. IMPRESSION: Bilateral nonobstructive nephrolithiasis. No hydronephrosis or renal obstruction is noted. Diverticulosis of descending and sigmoid colon is noted without inflammation. Mild prostatic enlargement. Electronically Signed   By: Lupita RaiderJames  Green Jr, M.D.   On: 11/18/2017 08:59   Dg Abdomen Acute W/chest  Result Date: 11/18/2017 CLINICAL DATA:  Abdominal pain, nausea and vomiting. Shortness of breath. EXAM: DG ABDOMEN ACUTE W/ 1V CHEST  COMPARISON:  None. FINDINGS: The cardiomediastinal contours are normal. The lungs are hyperinflated but clear. There is no free intra-abdominal air. No dilated bowel loops to suggest obstruction. Slight increased air throughout normal caliber small bowel in the right abdomen. Small volume of stool throughout the colon. No radiopaque calculi. No acute osseous abnormalities are seen. IMPRESSION: 1. Slight increased air within normal caliber small bowel loops in the right abdomen may represent enteritis or ileus. No obstruction or free air. 2. Hyperinflated but clear lungs. Electronically Signed   By: Rubye OaksMelanie  Ehinger M.D.   On: 11/18/2017 06:48    Impression/Plan: - coffee-ground emesis in setting of intractable nausea and vomiting. Most likely gastritis versus Mallory-Weiss tear. Apparently [facility] a [facility] a few days ago but I do not have a report available for review.relatively stable hemoglobin. - Intractable nausea and vomiting. Etiology unclear. CT scan and ultrasound negative for acute changes. It showed nonobstructive  Nephrolithiasis.normal LFTs. Normal lipase. ? Gastroenteritis. - elevated kidney function. Most likely acute kidney injury.  Recommendations ----------------------- - [facility] records for [facility] and [facility]  - get HIDA scan. - Continue supportive care for now. - If ongoing symptoms, consider repeat EGD in few days. - D/W Dr. Butler Denmarkizwan and Dr. Jerolyn CenterMathews. Records from outside hospital reviewed briefly.     LOS: 1 day   Kathi DerParag Marina Desire  MD, FACP 11/19/2017, 8:35 AM  Contact #  [phone]

## 2017-11-20 ENCOUNTER — Inpatient Hospital Stay (HOSPITAL_COMMUNITY): Payer: Self-pay

## 2017-11-20 LAB — CBC
HEMATOCRIT: 35 % — AB (ref 39.0–52.0)
HEMATOCRIT: 36.1 % — AB (ref 39.0–52.0)
HEMOGLOBIN: 11.8 g/dL — AB (ref 13.0–17.0)
Hemoglobin: 11.3 g/dL — ABNORMAL LOW (ref 13.0–17.0)
MCH: 29.1 pg (ref 26.0–34.0)
MCH: 29.4 pg (ref 26.0–34.0)
MCHC: 32.3 g/dL (ref 30.0–36.0)
MCHC: 32.7 g/dL (ref 30.0–36.0)
MCV: 90.2 fL (ref 78.0–100.0)
MCV: 89.8 fL (ref 78.0–100.0)
PLATELETS: 203 10*3/uL (ref 150–400)
Platelets: 219 10*3/uL (ref 150–400)
RBC: 3.88 MIL/uL — ABNORMAL LOW (ref 4.22–5.81)
RBC: 4.02 MIL/uL — ABNORMAL LOW (ref 4.22–5.81)
RDW: 13.7 % (ref 11.5–15.5)
RDW: 13.7 % (ref 11.5–15.5)
WBC: 7.1 10*3/uL (ref 4.0–10.5)
WBC: 8.5 10*3/uL (ref 4.0–10.5)

## 2017-11-20 LAB — HEMOGLOBIN A1C
Hgb A1c MFr Bld: 8.5 % — ABNORMAL HIGH (ref 4.8–5.6)
Mean Plasma Glucose: 197.25 mg/dL

## 2017-11-20 LAB — GLUCOSE, CAPILLARY
GLUCOSE-CAPILLARY: 142 mg/dL — AB (ref 70–99)
Glucose-Capillary: 184 mg/dL — ABNORMAL HIGH (ref 70–99)
Glucose-Capillary: 85 mg/dL (ref 70–99)
Glucose-Capillary: 89 mg/dL (ref 70–99)

## 2017-11-20 MED ORDER — TECHNETIUM TC 99M MEBROFENIN IV KIT
5.3000 | PACK | Freq: Once | INTRAVENOUS | Status: AC | PRN
  Administered 2017-11-20: 5.3 via INTRAVENOUS

## 2017-11-20 NOTE — Progress Notes (Signed)
PROGRESS NOTE    Thomas SlocumbWellington Haley  ZOX:[id number] DOB: 1971/03/25 DOA: 11/18/2017 PCP: Patient, No Pcp Per  Brief Narrative: 47 y.o.malewith medical history ofDM for the past 5 yrs, HTN who presents to the ED for vomiting and abdominal pain. 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  He was admitted for vomiting and abdominal pain at [facility] overnight and discharged yesterday. He states he was told his vomiting may be from his diabetes and was discharged with some medications that he has not yet had a chance to pick up. He has had ongoing vomiting and comes back to the ED. He states that there is a large amount of blood in his vomitus as well. He last vomited around 8 AM. He has pain in his mid abdomen which is a 7/10 right now and does not radiate. Pain medictaions make it better. He has not had any diarrhea. Stool has been brown and not bloody. No fever or chills. He states that he has had these symptoms for about 1 wk now and prior to this, he has never had any GI symptoms or weight loss. He does not drink alcohol.  He takes his insulin apprpriately and his diabetes "numbers" were about 7.5 when checked 3 months ago.  ED Course:given Reglan, Haldol, Labetalol, Dilaudid, Zofran, Pepcid and Phenergan. Also given NS boluses. Symptoms did not improve and he was referred for admission.   Assessment & Plan:   Principal Problem:   Intractable vomiting with nausea Active Problems:   DM (diabetes mellitus), type 2 (HCC)   HTN (hypertension)   Hematemesis   Gastroparesis   Nausea and vomiting 1] intractable nausea and vomiting-- and continues to have nausea and vomiting.  Overnight he received multiple doses of Zofran and Reglan.  He still complains of abdominal pain.  He has not had any further hematemesis.  Continue n.p.o.  HIDA scan pending for today.  Continue IV fluids continuous.  Continue morphine for pain  control. -Review outside records at the nurses station.  2] type 2 diabetes A1c is 8.5.  Patient started on low-dose Lantus at 8 units nightly.  Blood sugars have been stable.  He takes Lantus 15 units at home along with 5 units of NovoLog 3 times a day with meals.  3] uncontrolled hypertension has been resolved on  IV labetalol and hydralazine.  4] AKI improved we will recheck labs tomorrow.  AKI secondary to prerenal dehydration secondary to decreased p.o. intake and intractable nausea and vomiting.    DVT prophylaxis: scd Code Status:full Family Communication none Disposition Plan: tbd  Consultants: gi  Procedures: none Antimicrobials:none  Subjective: Continues to complain of ongoing nausea and vomiting and abdominal pain.  Continues to be n.p.o.  Objective: Vitals:   11/19/17 1700 11/19/17 2057 11/19/17 2215 11/20/17 0504  BP: 126/67 (!) 166/89 121/70 130/68  Pulse:  (!) 102  98  Resp:  18  14  Temp:  98.6 F (37 C)  98.8 F (37.1 C)  TempSrc:  Oral  Oral  SpO2:  98%  99%  Weight:      Height:        Intake/Output Summary (Last 24 hours) at 11/20/2017 1106 Last data filed at 11/20/2017 0556 Gross per 24 hour  Intake 2634.83 ml  Output 1600 ml  Net 1034.83 ml   Filed Weights   11/18/17 1500  Weight: 65.3 kg    Examination:  General exam: Appears  calm and comfortable  Respiratory system: Clear to auscultation. Respiratory effort normal. Cardiovascular system: S1 & S2 heard, RRR. No JVD, murmurs, rubs, gallops or clicks. No pedal edema. Gastrointestinal system: Abdomen is nondistended, soft and tender. No organomegaly or masses felt. Normal bowel sounds heard. Central nervous system: Alert and oriented. No focal neurological deficits. Extremities: Symmetric 5 x 5 power. Skin: No rashes, lesions or ulcers Psychiatry: Judgement and insight appear normal. Mood & affect appropriate.     Data Reviewed: I have personally reviewed following labs and imaging  studies  CBC: Recent Labs  Lab 11/18/17 0553 11/18/17 1818 11/19/17 0800 11/19/17 1804 11/20/17 0833  WBC 7.2 8.0 8.9 8.3 7.1  NEUTROABS 4.4  --   --   --   --   HGB 13.3 11.8* 12.3* 11.3* 11.3*  HCT 39.2 35.6* 37.0* 33.8* 35.0*  MCV 87.1 89.0 87.5 87.3 90.2  PLT 228 236 221 213 203   Basic Metabolic Panel: Recent Labs  Lab 11/18/17 0553 11/19/17 0800  NA 136 141  K 4.0 4.0  CL 99 105  CO2 24 25  GLUCOSE 289* 189*  BUN 33* 24*  CREATININE 2.41* 2.06*  CALCIUM 9.1 8.4*   GFR: Estimated Creatinine Clearance: 41.4 mL/min (A) (by C-G formula based on SCr of 2.06 mg/dL (H)). Liver Function Tests: Recent Labs  Lab 11/18/17 0553  AST 22  ALT 16  ALKPHOS 51  BILITOT 1.1  PROT 7.2  ALBUMIN 4.1   Recent Labs  Lab 11/18/17 0553  LIPASE 48   No results for input(s): AMMONIA in the last 168 hours. Coagulation Profile: Recent Labs  Lab 11/18/17 0553  INR 1.15   Cardiac Enzymes: Recent Labs  Lab 11/18/17 0553  TROPONINI <0.03   BNP (last 3 results) No results for input(s): PROBNP in the last 8760 hours. HbA1C: Recent Labs    11/20/17 0833  HGBA1C 8.5*   CBG: Recent Labs  Lab 11/19/17 0732 11/19/17 1139 11/19/17 1724 11/19/17 2100 11/20/17 0742  GLUCAP 191* 148* 87 108* 184*   Lipid Profile: No results for input(s): CHOL, HDL, LDLCALC, TRIG, CHOLHDL, LDLDIRECT in the last 72 hours. Thyroid Function Tests: No results for input(s): TSH, T4TOTAL, FREET4, T3FREE, THYROIDAB in the last 72 hours. Anemia Panel: No results for input(s): VITAMINB12, FOLATE, FERRITIN, TIBC, IRON, RETICCTPCT in the last 72 hours. Sepsis Labs: No results for input(s): PROCALCITON, LATICACIDVEN in the last 168 hours.  No results found for this or any previous visit (from the past 240 hour(s)).       Radiology Studies: No results found.      Scheduled Meds: . insulin aspart  0-5 Units Subcutaneous QHS  . insulin aspart  0-9 Units Subcutaneous TID WC  .  insulin glargine  8 Units Subcutaneous QHS  . metoCLOPramide (REGLAN) injection  10 mg Intravenous Q6H  . ondansetron  4 mg Oral QID   Or  . ondansetron (ZOFRAN) IV  4 mg Intravenous QID  . pantoprazole (PROTONIX) IV  40 mg Intravenous Q12H   Continuous Infusions: . sodium chloride 125 mL/hr at 11/20/17 0558     LOS: 2 days     Alwyn Ren, MD [facility]  If 7PM-7AM, please contact night-coverage [email or web address] Password [email or web address] 11/20/2017, 11:06 AM

## 2017-11-20 NOTE — Progress Notes (Signed)
-   Patient not in the room. Most likely getting HIDA scan.   EGD report from outside hospital reviewed.  EGD was done on 11/17/2017 by Dr. Brent Bullarivedi. It showed 2 cm HH otherwise normal EGD.   GI will follow tomorrow.   Kathi DerParag Zoa Dowty MD, FACP 11/20/2017, 2:58 PM  Contact #  [phone]

## 2017-11-21 LAB — COMPREHENSIVE METABOLIC PANEL
ALBUMIN: 3.3 g/dL — AB (ref 3.5–5.0)
ALT: 14 U/L (ref 0–44)
AST: 17 U/L (ref 15–41)
Alkaline Phosphatase: 39 U/L (ref 38–126)
Anion gap: 6 (ref 5–15)
BILIRUBIN TOTAL: 0.9 mg/dL (ref 0.3–1.2)
BUN: 19 mg/dL (ref 6–20)
CHLORIDE: 106 mmol/L (ref 98–111)
CO2: 26 mmol/L (ref 22–32)
Calcium: 8.3 mg/dL — ABNORMAL LOW (ref 8.9–10.3)
Creatinine, Ser: 2.12 mg/dL — ABNORMAL HIGH (ref 0.61–1.24)
GFR calc Af Amer: 41 mL/min — ABNORMAL LOW (ref >60)
GFR calc non Af Amer: 36 mL/min — ABNORMAL LOW (ref >60)
GLUCOSE: 166 mg/dL — AB (ref 70–99)
POTASSIUM: 3.8 mmol/L (ref 3.5–5.1)
SODIUM: 138 mmol/L (ref 135–145)
Total Protein: 5.8 g/dL — ABNORMAL LOW (ref 6.5–8.1)

## 2017-11-21 LAB — GLUCOSE, CAPILLARY
GLUCOSE-CAPILLARY: 185 mg/dL — AB (ref 70–99)
GLUCOSE-CAPILLARY: 238 mg/dL — AB (ref 70–99)
GLUCOSE-CAPILLARY: 242 mg/dL — AB (ref 70–99)
Glucose-Capillary: 41 mg/dL — CL (ref 70–99)
Glucose-Capillary: 39 mg/dL — CL (ref 70–99)
Glucose-Capillary: 146 mg/dL — ABNORMAL HIGH (ref 70–99)

## 2017-11-21 LAB — HIV ANTIBODY (ROUTINE TESTING W REFLEX): HIV Screen 4th Generation wRfx: NONREACTIVE

## 2017-11-21 MED ORDER — CHLORHEXIDINE GLUCONATE CLOTH 2 % EX PADS
6.0000 | MEDICATED_PAD | Freq: Once | CUTANEOUS | Status: AC
  Administered 2017-11-22: 6 via TOPICAL

## 2017-11-21 MED ORDER — SODIUM CHLORIDE 0.45 % IV SOLN
INTRAVENOUS | Status: DC
  Administered 2017-11-21 – 2017-11-22 (×2): via INTRAVENOUS

## 2017-11-21 MED ORDER — ACETAMINOPHEN 325 MG PO TABS
650.0000 mg | ORAL_TABLET | Freq: Once | ORAL | Status: AC
  Administered 2017-11-21: 650 mg via ORAL
  Filled 2017-11-21: qty 2

## 2017-11-21 MED ORDER — DEXTROSE 10 % IV SOLN
INTRAVENOUS | Status: DC
  Administered 2017-11-21: 13:00:00 via INTRAVENOUS
  Filled 2017-11-21: qty 1000

## 2017-11-21 MED ORDER — LABETALOL HCL 5 MG/ML IV SOLN: 5.0000 mg | Freq: Once | INTRAVENOUS | Status: DC

## 2017-11-21 MED ORDER — DEXTROSE 50 % IV SOLN
INTRAVENOUS | Status: AC
  Administered 2017-11-21: 25 mL via INTRAVENOUS
  Filled 2017-11-21: qty 50

## 2017-11-21 MED ORDER — LABETALOL HCL 5 MG/ML IV SOLN
10.0000 mg | Freq: Once | INTRAVENOUS | Status: AC
  Administered 2017-11-21: 10 mg via INTRAVENOUS
  Filled 2017-11-21: qty 4

## 2017-11-21 MED ORDER — SODIUM CHLORIDE 0.9 % IV SOLN
2.0000 g | INTRAVENOUS | Status: AC
  Administered 2017-11-22: 2 g via INTRAVENOUS
  Filled 2017-11-21: qty 20

## 2017-11-21 MED ORDER — CHLORHEXIDINE GLUCONATE CLOTH 2 % EX PADS
6.0000 | MEDICATED_PAD | Freq: Once | CUTANEOUS | Status: AC
  Administered 2017-11-21: 6 via TOPICAL

## 2017-11-21 NOTE — Care Management (Signed)
CM consult for medication needs. Pt was on Lantus and Novolog insulin prior to admit. CM will follow along for new medication needs.  Sandford Crazeora Stephine Langbehn RN,BSN [phone]

## 2017-11-21 NOTE — Progress Notes (Addendum)
I  Have notified Dr Veneta PentonMattewes  For [phone]  cbg  39   0853 I sent another message to Dr Ashley RoyaltyMatthews re cbg of 39.   1/2 amp of D 50  25 ML GIVEN

## 2017-11-21 NOTE — Progress Notes (Addendum)
[facility] Progress Note  Ezekiel SlocumbWellington Gene 47 y.o. 1970-11-21  CC:  intractable nausea and vomiting   Subjective: Limited history obtained from the patient. Discussed with the nursing staff.some improvement in symptoms but continues to have nausea and vomiting.  ROS : negative for chest pain or shortness of breath.  Objective: Vital signs in last 24 hours: Vitals:   11/21/17 0513 11/21/17 0756  BP: (!) 150/85 (!) 155/94  Pulse: (!) 106 100  Resp: 16 18  Temp: 98.2 F (36.8 C) 98.5 F (36.9 C)  SpO2: 96% 97%    Physical Exam:  Gen. Alert distress for a 3. Not in distress Abdomen. Soft, nontender, nondistended, bowel sounds present. No peritoneal signs LE : no edema  Psych : . Mood and affect normal    Lab Results: Recent Labs    11/19/17 0800  NA 141  K 4.0  CL 105  CO2 25  GLUCOSE 189*  BUN 24*  CREATININE 2.06*  CALCIUM 8.4*   No results for input(s): AST, ALT, ALKPHOS, BILITOT, PROT, ALBUMIN in the last 72 hours. Recent Labs    11/20/17 0833 11/20/17 1747  WBC 7.1 8.5  HGB 11.3* 11.8*  HCT 35.0* 36.1*  MCV 90.2 89.8  PLT 203 219   No results for input(s): LABPROT, INR in the last 72 hours.   EGD report from outside hospital reviewed.  EGD was done on 11/17/2017 by Dr. Brent Bullarivedi. It showed 2 cm HH otherwise normal EGD   Assessment/Plan: -  Intractable nausea and vomiting. Etiology unclear. CT scan and ultrasound negative for acute changes. It showed nonobstructive  Nephrolithiasis.normal LFTs. Normal lipase. Abnormal HIDA scan. Possible biliary dyskinesia coffee-ground emesis in setting of intractable nausea and vomiting. Resolved now. Hemoglobin stable. - elevated kidney function. Most likely acute on chronic kidney injury.  Recommendations ----------------------- - EGD report from outside hospital reviewed.  EGD was done on 11/17/2017 by Dr. Brent Bullarivedi. It showed 2 cm HH otherwise normal EGD - surgery consultation for abnormal HIDA  scan. - No further inpatient GI workup planned. GI will sign off. Call us back if needed.   Kathi DerParag Ridley Dileo MD, FACP 11/21/2017, 9:57 AM  Contact #  [phone]

## 2017-11-21 NOTE — Progress Notes (Signed)
PROGRESS NOTE    Thomas SlocumbWellington Haley  NWG:[id number] DOB: [birth year] DOA: 11/18/2017 PCP: Patient, No Pcp Per    Brief Narrative: 47 y.o.malewith medical history ofDM for the past 5 yrs, HTN who presents to the ED for vomiting and abdominal pain. 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  He was admitted for vomiting and abdominal pain at [facility] overnight and discharged yesterday. He states he was told his vomiting may be from his diabetes and was discharged with some medications that he has not yet had a chance to pick up. He has had ongoing vomiting and comes back to the ED. He states that there is a large amount of blood in his vomitus as well. He last vomited around 8 AM. He has pain in his mid abdomen which is a 7/10 right now and does not radiate. Pain medictaions make it better. He has not had any diarrhea. Stool has been brown and not bloody. No fever or chills. He states that he has had these symptoms for about 1 wk now and prior to this, he has never had any GI symptoms or weight loss. He does not drink alcohol.  He takes his insulin apprpriately and his diabetes "numbers" were about 7.5 when checked 3 months ago.  ED Course:given Reglan, Haldol, Labetalol, Dilaudid, Zofran, Pepcid and Phenergan. Also given NS boluses. Symptoms did not improve and he was referred for admission.  Assessment & Plan:   Principal Problem:   Intractable vomiting with nausea Active Problems:   DM (diabetes mellitus), type 2 (HCC)   HTN (hypertension)   Hematemesis   Gastroparesis   Nausea and vomiting   1] intractable nausea and vomiting-- and continues to have nausea and vomiting.  Overnight he received multiple doses of Zofran and Reglan.  He still complains of abdominal pain.  He has not had any further hematemesis.  Continue n.p.o.  HIDA scan shows decreased EF 7 % Plan   FOR cholecystectomy tomorrow.  2] type 2 diabetes  A1c is 8.5.  Patient started on low-dose Lantus at 8 units nightly.  Blood sugars have been stable.  He takes Lantus 15 units at home along with 5 units of NovoLog 3 times a day with meals.  3] uncontrolled hypertension has been resolved on  IV labetalol and hydralazine.  4] AKI improved we will recheck labs tomorrow.  AKI secondary to prerenal dehydration secondary to decreased p.o. intake and intractable nausea and vomiting.   DVT prophylaxis:scd Code Statusfull Family Communicationnone Disposition Plan:tbd  Consultants: gi signed off  General surgery   Procedures:none  Antimicrobials none  Subjective:complains of abdominal pain nausea   Objective: Vitals:   11/21/17 0513 11/21/17 0756 11/21/17 1221 11/21/17 1512  BP: (!) 150/85 (!) 155/94 (!) 170/90 (!) 143/72  Pulse: (!) 106 100  88  Resp: 16 18  18   Temp: 98.2 F (36.8 C) 98.5 F (36.9 C)  99.5 F (37.5 C)  TempSrc: Oral Oral  Oral  SpO2: 96% 97%  97%  Weight:      Height:        Intake/Output Summary (Last 24 hours) at 11/21/2017 1717 Last data filed at 11/21/2017 1247 Gross per 24 hour  Intake 1698.44 ml  Output 700 ml  Net 998.44 ml   Filed Weights   11/18/17 1500  Weight: 65.3 kg    Examination:  General exam: Appears calm and comfortable  Respiratory system: Clear to auscultation.  Respiratory effort normal. Cardiovascular system: S1 & S2 heard, RRR. No JVD, murmurs, rubs, gallops or clicks. No pedal edema. Gastrointestinal system: Abdomen is nondistended, soft and tender. No organomegaly or masses felt. Normal bowel sounds heard. Central nervous system: Alert and oriented. No focal neurological deficits. Extremities: Symmetric 5 x 5 power. Skin: No rashes, lesions or ulcers Psychiatry: Judgement and insight appear normal. Mood & affect appropriate.     Data Reviewed: I have personally reviewed following labs and imaging studies  CBC: Recent Labs  Lab 11/18/17 0553 11/18/17 1818  11/19/17 0800 11/19/17 1804 11/20/17 0833 11/20/17 1747  WBC 7.2 8.0 8.9 8.3 7.1 8.5  NEUTROABS 4.4  --   --   --   --   --   HGB 13.3 11.8* 12.3* 11.3* 11.3* 11.8*  HCT 39.2 35.6* 37.0* 33.8* 35.0* 36.1*  MCV 87.1 89.0 87.5 87.3 90.2 89.8  PLT 228 236 221 213 203 219   Basic Metabolic Panel: Recent Labs  Lab 11/18/17 0553 11/19/17 0800 11/21/17 1249  NA 136 141 138  K 4.0 4.0 3.8  CL 99 105 106  CO2 24 25 26   GLUCOSE 289* 189* 166*  BUN 33* 24* 19  CREATININE 2.41* 2.06* 2.12*  CALCIUM 9.1 8.4* 8.3*   GFR: Estimated Creatinine Clearance: 40.2 mL/min (A) (by C-G formula based on SCr of 2.12 mg/dL (H)). Liver Function Tests: Recent Labs  Lab 11/18/17 0553 11/21/17 1249  AST 22 17  ALT 16 14  ALKPHOS 51 39  BILITOT 1.1 0.9  PROT 7.2 5.8*  ALBUMIN 4.1 3.3*   Recent Labs  Lab 11/18/17 0553  LIPASE 48   No results for input(s): AMMONIA in the last 168 hours. Coagulation Profile: Recent Labs  Lab 11/18/17 0553  INR 1.15   Cardiac Enzymes: Recent Labs  Lab 11/18/17 0553  TROPONINI <0.03   BNP (last 3 results) No results for input(s): PROBNP in the last 8760 hours. HbA1C: Recent Labs    11/20/17 0833  HGBA1C 8.5*   CBG: Recent Labs  Lab 11/21/17 0748 11/21/17 0831 11/21/17 0954 11/21/17 1304 11/21/17 1701  GLUCAP 41* 39* 185* 146* 238*   Lipid Profile: No results for input(s): CHOL, HDL, LDLCALC, TRIG, CHOLHDL, LDLDIRECT in the last 72 hours. Thyroid Function Tests: No results for input(s): TSH, T4TOTAL, FREET4, T3FREE, THYROIDAB in the last 72 hours. Anemia Panel: No results for input(s): VITAMINB12, FOLATE, FERRITIN, TIBC, IRON, RETICCTPCT in the last 72 hours. Sepsis Labs: No results for input(s): PROCALCITON, LATICACIDVEN in the last 168 hours.  No results found for this or any previous visit (from the past 240 hour(s)).       Radiology Studies: Nm Hepato W/eject Fract  Result Date: 11/20/2017 CLINICAL DATA:  RIGHT upper  quadrant pain with nausea and vomiting for 2 days, chills, question fever, history diabetes mellitus, hypertension EXAM: NUCLEAR MEDICINE HEPATOBILIARY IMAGING WITH GALLBLADDER EF TECHNIQUE: Sequential images of the abdomen were obtained out to 60 minutes following intravenous administration of radiopharmaceutical. After oral ingestion of Ensure, gallbladder ejection fraction was determined. At 60 min, normal ejection fraction is greater than 33%. RADIOPHARMACEUTICALS:  5.3 mCi Tc-28m  Choletec IV COMPARISON:  None FINDINGS: Normal tracer extraction from bloodstream indicating normal hepatocellular function. Normal excretion of tracer into biliary tree. Gallbladder visualized at 24 min. Small bowel not visualized until following fatty meal stimulation No hepatic retention of tracer. Subjectively decreased emptying of tracer from gallbladder following fatty meal stimulation. Calculated gallbladder ejection fraction is 7%, abnormally low. Patient reported  no symptoms following Ensure ingestion. Normal gallbladder ejection fraction following Ensure ingestion is greater than 33% at 1 hour. IMPRESSION: Patent biliary tree without scintigraphic evidence of acute cholecystitis. Abnormal gallbladder response to fatty meal stimulation with a markedly decreased gallbladder ejection fraction of 7%. Electronically Signed   By: Ulyses Southward M.D.   On: 11/20/2017 18:07        Scheduled Meds: . Chlorhexidine Gluconate Cloth  6 each Topical Once   And  . [START ON 11/22/2017] Chlorhexidine Gluconate Cloth  6 each Topical Once  . insulin aspart  0-5 Units Subcutaneous QHS  . insulin aspart  0-9 Units Subcutaneous TID WC  . insulin glargine  8 Units Subcutaneous QHS  . metoCLOPramide (REGLAN) injection  10 mg Intravenous Q6H  . ondansetron  4 mg Oral QID   Or  . ondansetron (ZOFRAN) IV  4 mg Intravenous QID  . pantoprazole (PROTONIX) IV  40 mg Intravenous Q12H   Continuous Infusions: . [START ON 11/22/2017]  cefTRIAXone (ROCEPHIN)  IV    . dextrose 125 mL/hr at 11/21/17 1247     LOS: 3 days     Alwyn Ren, MD [facility] If 7PM-7AM, please contact night-coverage [email or web address] Password [facility] 11/21/2017, 5:17 PM

## 2017-11-21 NOTE — Consult Note (Signed)
[facility] Consult Note  Thomas SlocumbWellington Yeakle [birth year]  [id number].    Requesting MD: Kathi DerParag Brahmbhatt Chief Complaint/Reason for Consult: biliary dyskinesia   HPI:  Thomas Haley is a 47yo male admitted [facility] with abdominal pain, nausea, and vomiting. Patient states that this has been occurring intermittently for several years. States that it typically occurs about every 3 months and is self limiting. No specific inciting events, states that this is random. Not worse with PO intake. Abdominal pain is mostly epigastric. Current episode started about 1 week ago. He was initially admitted to [facility] where it was felt that his symptoms were due to gastroparesis. He underwent EGD which was negative. CT abdomen/pelvis showed no acute findings. WBC, LFTs, lipase WNL on admission. HIDA scan performed here yesterday shows no signs of acute cholecystitis but does show gallbladder ejection fraction of 7%.   Of note, patient does report suffering from chronic constipation. Typically has one BM a week. He does not take any medications to help with this. Patient also reports having a right inguinal hernia. It has been present for about 3 months. Typically only bothers him when he is vomiting or coughs a lot. It will swell when he vomits and return to normal size when he lies down.   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   HENT: Negative.   Eyes: Negative.   Respiratory: Negative.   Cardiovascular: Negative.   Gastrointestinal: Positive for abdominal pain, constipation, nausea and vomiting.  Genitourinary: Negative.   Musculoskeletal: Negative.   Skin: Negative.   Neurological: Negative.     All  systems reviewed and otherwise negative except for as above  History reviewed. No pertinent family history.  Past Medical History:  Diagnosis Date  . Diabetes mellitus without complication (HCC)   . Hypertension     History reviewed. No pertinent surgical history.  Social History:  reports that he quit smoking about 3 years ago. He has never used smokeless tobacco. His alcohol and drug histories are not on file.  Allergies:  Allergies  Allergen Reactions  . Shellfish Allergy     Medications Prior to Admission  Medication Sig Dispense Refill  . diltiazem (CARDIZEM SR) 90 MG 12 hr capsule Take 90 mg by mouth 2 times daily at 12 noon and 4 pm.  6  . ferrous sulfate 325 (65 FE) MG tablet Take 325 mg by mouth daily with breakfast.    . hydrochlorothiazide (MICROZIDE) 12.5 MG capsule Take 12.5 mg by mouth daily.      Prior to Admission medications   Medication Sig Start Date End Date Taking? Authorizing Provider  diltiazem (CARDIZEM SR) 90 MG 12 hr capsule Take 90 mg by mouth 2 times daily at 12 noon and 4 pm. 10/22/17  Yes [provider]  ferrous sulfate 325 (65 FE) MG tablet Take 325 mg by mouth daily with breakfast.   Yes [provider]  hydrochlorothiazide (MICROZIDE) 12.5 MG capsule Take 12.5 mg by mouth daily.   Yes [provider]    Blood pressure (!) 155/94, pulse 100, temperature 98.5 F (36.9 C), temperature source Oral, resp. rate 18, height 6' (1.829 m), weight 65.3 kg, SpO2 97 %. Physical Exam: General: pleasant, WD/WN male who is laying in bed in NAD HEENT:  head is normocephalic, atraumatic.  Sclera are noninjected.  Pupils equal and round.  Ears and nose without any masses or lesions.  Mouth is pink and moist. Dentition fair. Well healed incision from prior trach Heart: tachycardic. no obvious murmurs, gallops, or rubs noted.  Palpable pedal pulses bilaterally Lungs: CTAB, no wheezes, rhonchi, or rales noted.  Respiratory effort  nonlabored Abd: well healed incision from prior G-tube, soft, ND, +BS, no masses or organomegaly, TTP epigastric region and RUQ without rebound or guarding. Soft/reducible RIH MS: all 4 extremities are symmetrical with no cyanosis, clubbing, or edema. Skin: warm and dry with no masses, lesions, or rashes Psych: A&Ox3 with an appropriate affect. Neuro: cranial nerves grossly intact, extremity CSM intact bilaterally, normal speech  Results for orders placed or performed during the hospital encounter of 11/18/17 (from the past 48 hour(s))  Glucose, capillary     Status: Abnormal   Collection Time: 11/19/17 11:39 AM  Result Value Ref Range   Glucose-Capillary 148 (H) 70 - 99 mg/dL   Comment 1 Notify RN    Comment 2 Document in Chart   Glucose, capillary     Status: None   Collection Time: 11/19/17  5:24 PM  Result Value Ref Range   Glucose-Capillary 87 70 - 99 mg/dL   Comment 1 Notify RN    Comment 2 Document in Chart   CBC     Status: Abnormal   Collection Time: 11/19/17  6:04 PM  Result Value Ref Range   WBC 8.3 4.0 - 10.5 K/uL   RBC 3.87 (L) 4.22 - 5.81 MIL/uL   Hemoglobin 11.3 (L) 13.0 - 17.0 g/dL   HCT 96.0 (L) 45.4 - 09.8 %   MCV 87.3 78.0 - 100.0 fL   MCH 29.2 26.0 - 34.0 pg   MCHC 33.4 30.0 - 36.0 g/dL   RDW 11.9 14.7 - 82.9 %   Platelets 213 150 - 400 K/uL    Comment: Performed at [facility], [address], [place], [place] [address]  Glucose, capillary     Status: Abnormal   Collection Time: 11/19/17  9:00 PM  Result Value Ref Range   Glucose-Capillary 108 (H) 70 - 99 mg/dL  Glucose, capillary     Status: Abnormal   Collection Time: 11/20/17  7:42 AM  Result Value Ref Range   Glucose-Capillary 184 (H) 70 - 99 mg/dL   Comment 1 Notify RN    Comment 2 Document in Chart   Hemoglobin A1c     Status: Abnormal   Collection Time: 11/20/17  8:33 AM  Result Value Ref Range   Hgb A1c MFr Bld 8.5 (H) 4.8 - 5.6 %    Comment: (NOTE) Pre diabetes:           5.7%-6.4% Diabetes:              >6.4% Glycemic control for   <7.0% adults with diabetes    Mean Plasma Glucose 197.25 mg/dL    Comment: Performed at [facility], [address] [place], [place] [address]  CBC     Status: Abnormal   Collection Time: 11/20/17  8:33 AM  Result Value Ref Range   WBC 7.1 4.0 - 10.5 K/uL   RBC 3.88 (L) 4.22 - 5.81 MIL/uL   Hemoglobin 11.3 (L) 13.0 - 17.0 g/dL   HCT 84.6 (L) 96.2 - 95.2 %   MCV 90.2 78.0 - 100.0 fL   MCH 29.1 26.0 - 34.0 pg  MCHC 32.3 30.0 - 36.0 g/dL   RDW 78.2 95.6 - 21.3 %   Platelets 203 150 - 400 K/uL    Comment: Performed at [facility], [address] [place], [place] [address]  Glucose, capillary     Status: None   Collection Time: 11/20/17 11:34 AM  Result Value Ref Range   Glucose-Capillary 85 70 - 99 mg/dL   Comment 1 Notify RN    Comment 2 Document in Chart   Glucose, capillary     Status: Abnormal   Collection Time: 11/20/17  5:22 PM  Result Value Ref Range   Glucose-Capillary 142 (H) 70 - 99 mg/dL   Comment 1 Notify RN    Comment 2 Document in Chart   CBC     Status: Abnormal   Collection Time: 11/20/17  5:47 PM  Result Value Ref Range   WBC 8.5 4.0 - 10.5 K/uL   RBC 4.02 (L) 4.22 - 5.81 MIL/uL   Hemoglobin 11.8 (L) 13.0 - 17.0 g/dL   HCT 84.6 (L) 96.2 - 95.2 %   MCV 89.8 78.0 - 100.0 fL   MCH 29.4 26.0 - 34.0 pg   MCHC 32.7 30.0 - 36.0 g/dL   RDW 84.1 32.4 - 40.1 %   Platelets 219 150 - 400 K/uL    Comment: Performed at [facility], [address], [place], [place] [address]  Glucose, capillary     Status: None   Collection Time: 11/20/17  9:21 PM  Result Value Ref Range   Glucose-Capillary 89 70 - 99 mg/dL  Glucose, capillary     Status: Abnormal   Collection Time: 11/21/17  7:48 AM  Result Value Ref Range   Glucose-Capillary 41 (LL) 70 - 99 mg/dL   Comment 1 Notify RN    Comment 2 Document in Chart   Glucose, capillary     Status: Abnormal    Collection Time: 11/21/17  8:31 AM  Result Value Ref Range   Glucose-Capillary 39 (LL) 70 - 99 mg/dL  Glucose, capillary     Status: Abnormal   Collection Time: 11/21/17  9:54 AM  Result Value Ref Range   Glucose-Capillary 185 (H) 70 - 99 mg/dL   Nm Hepato W/eject Fract  Result Date: 11/20/2017 CLINICAL DATA:  RIGHT upper quadrant pain with nausea and vomiting for 2 days, chills, question fever, history diabetes mellitus, hypertension EXAM: NUCLEAR MEDICINE HEPATOBILIARY IMAGING WITH GALLBLADDER EF TECHNIQUE: Sequential images of the abdomen were obtained out to 60 minutes following intravenous administration of radiopharmaceutical. After oral ingestion of Ensure, gallbladder ejection fraction was determined. At 60 min, normal ejection fraction is greater than 33%. RADIOPHARMACEUTICALS:  5.3 mCi Tc-45m  Choletec IV COMPARISON:  None FINDINGS: Normal tracer extraction from bloodstream indicating normal hepatocellular function. Normal excretion of tracer into biliary tree. Gallbladder visualized at 24 min. Small bowel not visualized until following fatty meal stimulation No hepatic retention of tracer. Subjectively decreased emptying of tracer from gallbladder following fatty meal stimulation. Calculated gallbladder ejection fraction is 7%, abnormally low. Patient reported no symptoms following Ensure ingestion. Normal gallbladder ejection fraction following Ensure ingestion is greater than 33% at 1 hour. IMPRESSION: Patent biliary tree without scintigraphic evidence of acute cholecystitis. Abnormal gallbladder response to fatty meal stimulation with a markedly decreased gallbladder ejection fraction of 7%. Electronically Signed   By: Ulyses Southward M.D.   On: 11/20/2017 18:07   Anti-infectives (From admission, onward)   None  Assessment/Plan HTN DM, uncontrolled - A1c 8.5 Acute on ?chronic kidney disease Chronic constipation Right inguinal hernia - soft and reducible, can f/u outpatient to  discuss surgical repair H/o tracheostomy/PEG ~2017 - since removed  Abdominal pain, nausea, vomiting Biliary dyskinesia - Workup for abdominal pain, nausea, and vomiting included negative CT scan abdomen/pelvis, EGD at outside hospital, and lab work. HIDA positive for biliary dyskinesia. Discussed risks and benefits to cholecystectomy with patient (including persistent symptoms, bleeding, infection, anesthesia...) and he wishes to proceed. Ok for clear liquids today, NPO after midnight and will plan for cholecystectomy tomorrow.  ID - rocephin on call to OR VTE - SCDs FEN - IVF, CLD, NPO after MN Foley - none   Franne Forts, [facility] 11/21/2017, 11:32 AM Pager: [phone] Consults: [phone] Mon 7:00 am -11:30 AM Tues-Fri 7:00 am-4:30 pm Sat-Sun 7:00 am-11:30 am

## 2017-11-22 ENCOUNTER — Inpatient Hospital Stay (HOSPITAL_COMMUNITY): Payer: Self-pay

## 2017-11-22 ENCOUNTER — Encounter (HOSPITAL_COMMUNITY)
Admission: EM | Disposition: A | Discharge status: Home / Self Care | Payer: Self-pay | Source: Home / Self Care | Attending: Internal Medicine

## 2017-11-22 ENCOUNTER — Inpatient Hospital Stay (HOSPITAL_COMMUNITY): Payer: Self-pay | Admitting: Anesthesiology

## 2017-11-22 ENCOUNTER — Encounter (HOSPITAL_COMMUNITY): Payer: Self-pay | Admitting: Surgery

## 2017-11-22 DIAGNOSIS — K920 Hematemesis: Secondary | ICD-10-CM

## 2017-11-22 DIAGNOSIS — N183 Chronic kidney disease, stage 3 unspecified: Secondary | ICD-10-CM

## 2017-11-22 DIAGNOSIS — Z794 Long term (current) use of insulin: Secondary | ICD-10-CM

## 2017-11-22 DIAGNOSIS — Z789 Other specified health status: Secondary | ICD-10-CM

## 2017-11-22 DIAGNOSIS — E1121 Type 2 diabetes mellitus with diabetic nephropathy: Secondary | ICD-10-CM

## 2017-11-22 DIAGNOSIS — I10 Essential (primary) hypertension: Secondary | ICD-10-CM

## 2017-11-22 DIAGNOSIS — N179 Acute kidney failure, unspecified: Secondary | ICD-10-CM

## 2017-11-22 DIAGNOSIS — K3184 Gastroparesis: Secondary | ICD-10-CM

## 2017-11-22 DIAGNOSIS — R112 Nausea with vomiting, unspecified: Secondary | ICD-10-CM

## 2017-11-22 DIAGNOSIS — K811 Chronic cholecystitis: Secondary | ICD-10-CM

## 2017-11-22 HISTORY — PX: LAPAROSCOPIC CHOLECYSTECTOMY SINGLE SITE WITH INTRAOPERATIVE CHOLANGIOGRAM: SHX6538

## 2017-11-22 LAB — COMPREHENSIVE METABOLIC PANEL
ALBUMIN: 3.5 g/dL (ref 3.5–5.0)
ALK PHOS: 38 U/L (ref 38–126)
ALT: 12 U/L (ref 0–44)
AST: 20 U/L (ref 15–41)
Anion gap: 7 (ref 5–15)
BILIRUBIN TOTAL: 1 mg/dL (ref 0.3–1.2)
BUN: 14 mg/dL (ref 6–20)
CALCIUM: 8.2 mg/dL — AB (ref 8.9–10.3)
CO2: 28 mmol/L (ref 22–32)
Chloride: 101 mmol/L (ref 98–111)
Creatinine, Ser: 2.03 mg/dL — ABNORMAL HIGH (ref 0.61–1.24)
GFR calc Af Amer: 44 mL/min — ABNORMAL LOW (ref >60)
GFR, EST NON AFRICAN AMERICAN: 38 mL/min — AB (ref >60)
GLUCOSE: 117 mg/dL — AB (ref 70–99)
POTASSIUM: 3.7 mmol/L (ref 3.5–5.1)
Sodium: 136 mmol/L (ref 135–145)
TOTAL PROTEIN: 5.8 g/dL — AB (ref 6.5–8.1)

## 2017-11-22 LAB — CBC WITH DIFFERENTIAL/PLATELET
BASOS ABS: 0.1 10*3/uL (ref 0.0–0.1)
BASOS PCT: 1 %
Eosinophils Absolute: 0.4 10*3/uL (ref 0.0–0.7)
Eosinophils Relative: 7 %
HEMATOCRIT: 34.9 % — AB (ref 39.0–52.0)
HEMOGLOBIN: 11.4 g/dL — AB (ref 13.0–17.0)
LYMPHS PCT: 25 %
Lymphs Abs: 1.5 10*3/uL (ref 0.7–4.0)
MCH: 29.3 pg (ref 26.0–34.0)
MCHC: 32.7 g/dL (ref 30.0–36.0)
MCV: 89.7 fL (ref 78.0–100.0)
MONO ABS: 0.6 10*3/uL (ref 0.1–1.0)
MONOS PCT: 10 %
NEUTROS ABS: 3.5 10*3/uL (ref 1.7–7.7)
NEUTROS PCT: 57 %
Platelets: 192 10*3/uL (ref 150–400)
RBC: 3.89 MIL/uL — ABNORMAL LOW (ref 4.22–5.81)
RDW: 13.5 % (ref 11.5–15.5)
WBC: 6 10*3/uL (ref 4.0–10.5)

## 2017-11-22 LAB — GLUCOSE, CAPILLARY
GLUCOSE-CAPILLARY: 140 mg/dL — AB (ref 70–99)
GLUCOSE-CAPILLARY: 168 mg/dL — AB (ref 70–99)
GLUCOSE-CAPILLARY: 282 mg/dL — AB (ref 70–99)
Glucose-Capillary: 126 mg/dL — ABNORMAL HIGH (ref 70–99)
Glucose-Capillary: 290 mg/dL — ABNORMAL HIGH (ref 70–99)

## 2017-11-22 LAB — SURGICAL PCR SCREEN
MRSA, PCR: NEGATIVE
Staphylococcus aureus: POSITIVE — AB

## 2017-11-22 LAB — PHOSPHORUS: Phosphorus: 2.6 mg/dL (ref 2.5–4.6)

## 2017-11-22 LAB — MAGNESIUM: MAGNESIUM: 1.5 mg/dL — AB (ref 1.7–2.4)

## 2017-11-22 SURGERY — LAPAROSCOPIC CHOLECYSTECTOMY SINGLE SITE WITH INTRAOPERATIVE CHOLANGIOGRAM
Anesthesia: General | Site: Abdomen

## 2017-11-22 MED ORDER — SUGAMMADEX SODIUM 200 MG/2ML IV SOLN
INTRAVENOUS | Status: AC
  Filled 2017-11-22: qty 2

## 2017-11-22 MED ORDER — OXYCODONE HCL 5 MG PO TABS
5.0000 mg | ORAL_TABLET | ORAL | Status: DC | PRN
  Administered 2017-11-23: 10 mg via ORAL
  Administered 2017-11-23: 5 mg via ORAL
  Filled 2017-11-22 (×2): qty 1
  Filled 2017-11-22: qty 2

## 2017-11-22 MED ORDER — INSULIN GLARGINE 100 UNIT/ML ~~LOC~~ SOLN
8.0000 [IU] | Freq: Every day | SUBCUTANEOUS | Status: DC
  Administered 2017-11-22: 8 [IU] via SUBCUTANEOUS
  Filled 2017-11-22 (×2): qty 0.08

## 2017-11-22 MED ORDER — LIDOCAINE HCL (CARDIAC) PF 100 MG/5ML IV SOSY
PREFILLED_SYRINGE | INTRAVENOUS | Status: DC | PRN
  Administered 2017-11-22: 50 mg via INTRAVENOUS

## 2017-11-22 MED ORDER — BUPIVACAINE-EPINEPHRINE (PF) 0.25% -1:200000 IJ SOLN
INTRAMUSCULAR | Status: AC
  Filled 2017-11-22: qty 60

## 2017-11-22 MED ORDER — BUPIVACAINE LIPOSOME 1.3 % IJ SUSP
20.0000 mL | Freq: Once | INTRAMUSCULAR | Status: AC
  Administered 2017-11-22: 20 mL
  Filled 2017-11-22: qty 20

## 2017-11-22 MED ORDER — ACETAMINOPHEN 500 MG PO TABS
1000.0000 mg | ORAL_TABLET | ORAL | Status: AC
  Administered 2017-11-22: 1000 mg via ORAL
  Filled 2017-11-22: qty 2

## 2017-11-22 MED ORDER — OXYCODONE HCL 5 MG/5ML PO SOLN
5.0000 mg | Freq: Once | ORAL | Status: DC | PRN
  Filled 2017-11-22: qty 5

## 2017-11-22 MED ORDER — PROPOFOL 10 MG/ML IV BOLUS
INTRAVENOUS | Status: AC
  Filled 2017-11-22: qty 20

## 2017-11-22 MED ORDER — MAGNESIUM SULFATE 2 GM/50ML IV SOLN
2.0000 g | Freq: Once | INTRAVENOUS | Status: AC
  Administered 2017-11-22: 2 g via INTRAVENOUS
  Filled 2017-11-22: qty 50

## 2017-11-22 MED ORDER — HYDROMORPHONE HCL 1 MG/ML IJ SOLN: 0.2500 mg | INTRAMUSCULAR | Status: DC | PRN

## 2017-11-22 MED ORDER — BUPIVACAINE-EPINEPHRINE 0.25% -1:200000 IJ SOLN
INTRAMUSCULAR | Status: DC | PRN
  Administered 2017-11-22: 30 mL

## 2017-11-22 MED ORDER — IOPAMIDOL (ISOVUE-300) INJECTION 61%
INTRAVENOUS | Status: DC | PRN
  Administered 2017-11-22: 10 mL

## 2017-11-22 MED ORDER — MIDAZOLAM HCL 5 MG/5ML IJ SOLN
INTRAMUSCULAR | Status: DC | PRN
  Administered 2017-11-22: 2 mg via INTRAVENOUS

## 2017-11-22 MED ORDER — FENTANYL CITRATE (PF) 100 MCG/2ML IJ SOLN
INTRAMUSCULAR | Status: DC | PRN
  Administered 2017-11-22 (×2): 100 ug via INTRAVENOUS

## 2017-11-22 MED ORDER — LIDOCAINE 2% (20 MG/ML) 5 ML SYRINGE
INTRAMUSCULAR | Status: DC | PRN
  Administered 2017-11-22: 1.5 mg/kg/h via INTRAVENOUS

## 2017-11-22 MED ORDER — IOPAMIDOL (ISOVUE-300) INJECTION 61%
INTRAVENOUS | Status: AC
  Filled 2017-11-22: qty 50

## 2017-11-22 MED ORDER — PHENYLEPHRINE 40 MCG/ML (10ML) SYRINGE FOR IV PUSH (FOR BLOOD PRESSURE SUPPORT)
PREFILLED_SYRINGE | INTRAVENOUS | Status: AC
  Filled 2017-11-22: qty 10

## 2017-11-22 MED ORDER — ROCURONIUM BROMIDE 10 MG/ML (PF) SYRINGE
PREFILLED_SYRINGE | INTRAVENOUS | Status: DC | PRN
  Administered 2017-11-22: 50 mg via INTRAVENOUS
  Administered 2017-11-22: 10 mg via INTRAVENOUS

## 2017-11-22 MED ORDER — KETAMINE HCL 10 MG/ML IJ SOLN
INTRAMUSCULAR | Status: AC
  Filled 2017-11-22: qty 1

## 2017-11-22 MED ORDER — SUGAMMADEX SODIUM 200 MG/2ML IV SOLN
INTRAVENOUS | Status: DC | PRN
  Administered 2017-11-22: 200 mg via INTRAVENOUS

## 2017-11-22 MED ORDER — LACTATED RINGERS IV SOLN
INTRAVENOUS | Status: DC
  Administered 2017-11-22 (×2): via INTRAVENOUS

## 2017-11-22 MED ORDER — ONDANSETRON HCL 4 MG/2ML IJ SOLN
INTRAMUSCULAR | Status: AC
  Filled 2017-11-22: qty 2

## 2017-11-22 MED ORDER — ONDANSETRON HCL 4 MG/2ML IJ SOLN
INTRAMUSCULAR | Status: DC | PRN
  Administered 2017-11-22: 4 mg via INTRAVENOUS

## 2017-11-22 MED ORDER — MORPHINE SULFATE (PF) 2 MG/ML IV SOLN
1.0000 mg | INTRAVENOUS | Status: DC | PRN
  Administered 2017-11-22: 2 mg via INTRAVENOUS
  Filled 2017-11-22 (×2): qty 1

## 2017-11-22 MED ORDER — PROMETHAZINE HCL 25 MG/ML IJ SOLN: 6.2500 mg | INTRAMUSCULAR | Status: DC | PRN

## 2017-11-22 MED ORDER — PROPOFOL 10 MG/ML IV BOLUS
INTRAVENOUS | Status: DC | PRN
  Administered 2017-11-22: 150 mg via INTRAVENOUS

## 2017-11-22 MED ORDER — LACTATED RINGERS IR SOLN
Status: DC | PRN
  Administered 2017-11-22: 1000 mL

## 2017-11-22 MED ORDER — GABAPENTIN 300 MG PO CAPS
300.0000 mg | ORAL_CAPSULE | ORAL | Status: AC
  Administered 2017-11-22: 300 mg via ORAL
  Filled 2017-11-22: qty 1

## 2017-11-22 MED ORDER — PHENYLEPHRINE HCL 10 MG/ML IJ SOLN
INTRAMUSCULAR | Status: DC | PRN
  Administered 2017-11-22: 80 ug via INTRAVENOUS

## 2017-11-22 MED ORDER — 0.9 % SODIUM CHLORIDE (POUR BTL) OPTIME
TOPICAL | Status: DC | PRN
  Administered 2017-11-22: 1000 mL

## 2017-11-22 MED ORDER — FENTANYL CITRATE (PF) 250 MCG/5ML IJ SOLN
INTRAMUSCULAR | Status: AC
  Filled 2017-11-22: qty 5

## 2017-11-22 MED ORDER — LIDOCAINE 2% (20 MG/ML) 5 ML SYRINGE
INTRAMUSCULAR | Status: AC
  Filled 2017-11-22: qty 5

## 2017-11-22 MED ORDER — DEXAMETHASONE SODIUM PHOSPHATE 4 MG/ML IJ SOLN
4.0000 mg | INTRAMUSCULAR | Status: AC
  Administered 2017-11-22: 4 mg via INTRAVENOUS
  Filled 2017-11-22: qty 1

## 2017-11-22 MED ORDER — MIDAZOLAM HCL 2 MG/2ML IJ SOLN
INTRAMUSCULAR | Status: AC
  Filled 2017-11-22: qty 2

## 2017-11-22 MED ORDER — ENSURE PRE-SURGERY PO LIQD: 296.0000 mL | Freq: Once | ORAL | Status: DC

## 2017-11-22 MED ORDER — KETAMINE HCL 10 MG/ML IJ SOLN
INTRAMUSCULAR | Status: DC | PRN
  Administered 2017-11-22: 30 mg via INTRAVENOUS

## 2017-11-22 MED ORDER — LACTATED RINGERS IV BOLUS
500.0000 mL | Freq: Once | INTRAVENOUS | Status: AC
  Administered 2017-11-22: 500 mL via INTRAVENOUS

## 2017-11-22 MED ORDER — ROCURONIUM BROMIDE 100 MG/10ML IV SOLN
INTRAVENOUS | Status: AC
  Filled 2017-11-22: qty 1

## 2017-11-22 MED ORDER — DEXAMETHASONE SODIUM PHOSPHATE 10 MG/ML IJ SOLN
INTRAMUSCULAR | Status: DC | PRN
  Administered 2017-11-22: 10 mg via INTRAVENOUS

## 2017-11-22 MED ORDER — ACETAMINOPHEN 500 MG PO TABS
1000.0000 mg | ORAL_TABLET | Freq: Three times a day (TID) | ORAL | Status: DC
  Administered 2017-11-22 – 2017-11-23 (×3): 1000 mg via ORAL
  Filled 2017-11-22 (×3): qty 2

## 2017-11-22 MED ORDER — OXYCODONE HCL 5 MG PO TABS: 5.0000 mg | ORAL_TABLET | Freq: Once | ORAL | Status: DC | PRN

## 2017-11-22 SURGICAL SUPPLY — 47 items
APPLIER CLIP 5 13 M/L LIGAMAX5 (MISCELLANEOUS) ×3
APPLIER CLIP ROT 10 11.4 M/L (STAPLE)
CABLE HIGH FREQUENCY MONO STRZ (ELECTRODE) ×3 IMPLANT
CHLORAPREP W/TINT 26ML (MISCELLANEOUS) ×3 IMPLANT
CLIP APPLIE 5 13 M/L LIGAMAX5 (MISCELLANEOUS) ×2 IMPLANT
CLIP APPLIE ROT 10 11.4 M/L (STAPLE) IMPLANT
COVER MAYO STAND STRL (DRAPES) ×3 IMPLANT
COVER SURGICAL LIGHT HANDLE (MISCELLANEOUS) ×3 IMPLANT
DECANTER SPIKE VIAL GLASS SM (MISCELLANEOUS) ×3 IMPLANT
DRAPE C-ARM 42X120 X-RAY (DRAPES) ×3 IMPLANT
DRAPE UTILITY XL STRL (DRAPES) ×3 IMPLANT
DRAPE WARM FLUID 44X44 (DRAPE) ×3 IMPLANT
DRSG TEGADERM 2-3/8X2-3/4 SM (GAUZE/BANDAGES/DRESSINGS) IMPLANT
DRSG TEGADERM 4X4.75 (GAUZE/BANDAGES/DRESSINGS) ×3 IMPLANT
ELECT REM PT RETURN 15FT ADLT (MISCELLANEOUS) ×3 IMPLANT
GAUZE SPONGE 2X2 8PLY STRL LF (GAUZE/BANDAGES/DRESSINGS) ×2 IMPLANT
GLOVE BIO SURGEON STRL SZ7 (GLOVE) ×3 IMPLANT
GLOVE BIOGEL PI IND STRL 7.0 (GLOVE) ×6 IMPLANT
GLOVE BIOGEL PI IND STRL 7.5 (GLOVE) ×2 IMPLANT
GLOVE BIOGEL PI INDICATOR 7.0 (GLOVE) ×3
GLOVE BIOGEL PI INDICATOR 7.5 (GLOVE) ×1
GLOVE ECLIPSE 8.0 STRL XLNG CF (GLOVE) ×3 IMPLANT
GLOVE INDICATOR 8.0 STRL GRN (GLOVE) ×3 IMPLANT
GLOVE SURG SS PI 7.0 STRL IVOR (GLOVE) ×3 IMPLANT
GOWN STRL REUS W/ TWL LRG LVL3 (GOWN DISPOSABLE) ×2 IMPLANT
GOWN STRL REUS W/TWL LRG LVL3 (GOWN DISPOSABLE) ×1
GOWN STRL REUS W/TWL XL LVL3 (GOWN DISPOSABLE) ×9 IMPLANT
IRRIG SUCT STRYKERFLOW 2 WTIP (MISCELLANEOUS) ×3
IRRIGATION SUCT STRKRFLW 2 WTP (MISCELLANEOUS) ×2 IMPLANT
KIT BASIN OR (CUSTOM PROCEDURE TRAY) ×3 IMPLANT
PAD POSITIONING PINK XL (MISCELLANEOUS) ×3 IMPLANT
POSITIONER SURGICAL ARM (MISCELLANEOUS) ×3 IMPLANT
POUCH SPECIMEN RETRIEVAL 10MM (ENDOMECHANICALS) IMPLANT
SCISSORS LAP 5X35 DISP (ENDOMECHANICALS) ×3 IMPLANT
SET CHOLANGIOGRAPH MIX (MISCELLANEOUS) ×3 IMPLANT
SHEARS HARMONIC ACE PLUS 45CM (MISCELLANEOUS) ×3 IMPLANT
SLEEVE XCEL OPT CAN 5 100 (ENDOMECHANICALS) ×3 IMPLANT
SPONGE GAUZE 2X2 STER 10/PKG (GAUZE/BANDAGES/DRESSINGS) ×1
SUT MNCRL AB 4-0 PS2 18 (SUTURE) ×3 IMPLANT
SUT PDS AB 1 CT1 27 (SUTURE) ×9 IMPLANT
SYR 20CC LL (SYRINGE) ×3 IMPLANT
TOWEL OR 17X26 10 PK STRL BLUE (TOWEL DISPOSABLE) ×3 IMPLANT
TOWEL OR NON WOVEN STRL DISP B (DISPOSABLE) ×3 IMPLANT
TRAY LAPAROSCOPIC (CUSTOM PROCEDURE TRAY) ×3 IMPLANT
TROCAR BLADELESS OPT 5 100 (ENDOMECHANICALS) ×3 IMPLANT
TROCAR XCEL NON-BLD 11X100MML (ENDOMECHANICALS) IMPLANT
TUBING INSUF HEATED (TUBING) IMPLANT

## 2017-11-22 NOTE — Anesthesia Procedure Notes (Signed)
Procedure Name: Intubation Date/Time: 11/22/2017 1:50 PM Performed by: Thornell MuleStubblefield, Jatavian Calica G, CRNA Pre-anesthesia Checklist: Patient identified, Emergency Drugs available, Suction available and Patient being monitored Patient Re-evaluated:Patient Re-evaluated prior to induction Oxygen Delivery Method: Circle system utilized Preoxygenation: Pre-oxygenation with 100% oxygen Induction Type: IV induction Ventilation: Mask ventilation without difficulty Laryngoscope Size: Miller and 3 Grade View: Grade II Tube type: Oral Tube size: 7.5 mm Number of attempts: 1 Airway Equipment and Method: Stylet and Oral airway Placement Confirmation: ETT inserted through vocal cords under direct vision,  positive ETCO2 and breath sounds checked- equal and bilateral Secured at: 21 cm Tube secured with: Tape Dental Injury: Teeth and Oropharynx as per pre-operative assessment

## 2017-11-22 NOTE — Anesthesia Preprocedure Evaluation (Addendum)
Anesthesia Evaluation  Patient identified by MRN, date of birth, ID band Patient awake    Reviewed: Allergy & Precautions, NPO status , Patient's Chart, lab work & pertinent test results  Airway Mallampati: III  TM Distance: >3 FB Neck ROM: Full    Dental  (+) Missing,    Pulmonary former smoker,    Pulmonary exam normal breath sounds clear to auscultation       Cardiovascular hypertension, Pt. on medications Normal cardiovascular exam Rhythm:Regular Rate:Normal  ECG: ST, rate 109   Neuro/Psych negative neurological ROS  negative psych ROS   GI/Hepatic Neg liver ROS, hiatal hernia, N/V   Endo/Other  diabetes, Insulin Dependent  Renal/GU Renal InsufficiencyRenal disease     Musculoskeletal negative musculoskeletal ROS (+)   Abdominal   Peds  Hematology  (+) anemia ,   Anesthesia Other Findings BILIARY DYSKENSIA  Reproductive/Obstetrics                           Anesthesia Physical Anesthesia Plan  ASA: II  Anesthesia Plan: General   Post-op Pain Management:    Induction: Intravenous and Rapid sequence  PONV Risk Score and Plan: 3 and Ondansetron, Dexamethasone, Midazolam and Treatment may vary due to age or medical condition  Airway Management Planned: Oral ETT  Additional Equipment:   Intra-op Plan:   Post-operative Plan: Extubation in OR  Informed Consent: I have reviewed the patients History and Physical, chart, labs and discussed the procedure including the risks, benefits and alternatives for the proposed anesthesia with the patient or authorized representative who has indicated his/her understanding and acceptance.   Dental advisory given  Plan Discussed with: CRNA  Anesthesia Plan Comments: (Stratus video interpreting utilized.)     Anesthesia Quick Evaluation

## 2017-11-22 NOTE — Transfer of Care (Signed)
Immediate Anesthesia Transfer of Care Note  Patient: Thomas SlocumbWellington Sando  Procedure(s) Performed: LAPAROSCOPIC CHOLECYSTECTOMY SINGLE SITE WITH INTRAOPERATIVE CHOLANGIOGRAM (N/A Abdomen)  Patient Location: PACU  Anesthesia Type:General  Level of Consciousness: sedated  Airway & Oxygen Therapy: Patient Spontanous Breathing and Patient connected to face mask oxygen  Post-op Assessment: Report given to RN and Post -op Vital signs reviewed and stable  Post vital signs: Reviewed and stable  Last Vitals:  Vitals Value Taken Time  BP    Temp    Pulse 112 11/22/2017  3:03 PM  Resp 15 11/22/2017  3:02 PM  SpO2 100 % 11/22/2017  3:03 PM  Vitals shown include unvalidated device data.  Last Pain:  Vitals:   11/22/17 1000  TempSrc:   PainSc: 8       Patients Stated Pain Goal: 2 (11/22/17 1000)  Complications: No apparent anesthesia complications

## 2017-11-22 NOTE — Progress Notes (Signed)
PROGRESS NOTE    Thomas Haley  ZOX:[id number] DOB: Oct 23, 1970 DOA: 11/18/2017 PCP: Patient, No Pcp Per  Brief Narrative: The patient is a 47 y.o.malewith medical history ofDM for the past 5 yrs, HTN who presents to the ED for vomiting and abdominal pain. 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   He was admitted for vomiting and abdominal pain at [facility] overnight and discharged yesterday. He states he was told his vomiting may be from his diabetes and was discharged with some medications that he has not yet had a chance to pick up. He has had ongoing vomiting and comes back to the ED. He stated that there is a large amount of blood in his vomitus as well.   He last vomited around 8 AM. He has pain in his mid abdomen which is a 7/10 right now and does not radiate. Pain medictaions make it better. He has not had any diarrhea. Stool has been brown and not bloody. No fever or chills. He states that he has had these symptoms for about 1 wk now and prior to this, he has never had any GI symptoms or weight loss. He does not drink alcohol.   He takes his insulin apprpriately and his diabetes "numbers" were about 7.5 when checked 3 months ago.  He was admitted for his Abdominal Pain, Nausea, and Vomiting and GI was consulted. HIDA scan was abnormal and showed EF of 7%. General Surgery was consulted and planning on doing Cholecystectomy today.   Assessment & Plan:   Principal Problem:   Intractable vomiting with nausea Active Problems:   DM (diabetes mellitus), type 2 (HCC)   HTN (hypertension)   Hematemesis   Gastroparesis   Nausea and vomiting  Intractable Nausea and Vomiting along with Abdominal Pain and recent Hematemesis in the setting of Biliary Dyskinesia r/o Gastroparesis  -Continued to have nausea and vomiting.  -Overnight 11/21/17 he received multiple doses of Zofranand Reglan.  -He still complains of  abdominal pain. He has not had any further hematemesis.  -Continue NPO Status for now.  -HIDA scan shows decreased EF 7 % -GI Consulted and reviewed EGD done by Dr. Brent Bulla and was normal but showed 2 cm Hiatial Hernia -GI not planning any further workup given stable Hb and resolved hematemesis and recommened Surgical consultation for likely Biliary Dyskinesia for EF of 7% -General Surgery taking the patient for Cholecystectomy today -C/w IV Metaclopramide and IV Pantoprazole q12h -Continue to Monitor after Cholecystectomy   Type 2 Diabetes complicated by Neuropathy  -HbA1c is 8.5.  -Patient started on low-dose Lantus at 8 units nightly. Along with Sensitive Novolog SSI AC/HS  -Blood sugars have been stable and ranging from 85-242.  -He takes Lantus 15 units at home along with 5 units of NovoLog 3 times a day with meals. -Continue to Monitor Carefully as he is about to get Dexamethasone pre-operatively   Uncontrolled Hypertension  -has been impving on IV labetalol and hydralazine. -C/w PRN Hydralazine and PRN Labetalol  -Patient's BP was 160/90  AKI but possible that patient has some CKD and likley from Diabetes  -improving AKI secondary to prerenal dehydration secondary to decreased p.o. intake and intractable nausea and vomiting. -BUN/Cr went from 33/2.41 -> 14/2.03 -C/w 1/2 NS but increase to 75 mL/hr -Avoid Nephrotoxic medications if possible -Repeat CMP in AM   Hypomagnesemia  -Patient's Mag Level was 1.5 this AM -Replete with IV Mag Sulfate  2 grams -Continue to Monitor and Replete as Necessary  -Repeat Mag Level in AM   Normocytic Anemia -Patient's Hb/Hct went from 11.3/35.0 -> 11.8/36.1 -> 11.4/34.9 -Continue to monitor for S/Sx of Bleeding -Check Anemia Panel in AM  -Repeat CBC in AM   DVT prophylaxis: SCDs Code Status: FULL CODE  Family Communication: No family present at bedside Disposition Plan: Pending improvement and Clearance by Surgery    Consultants:   [facility] (Signed off)  General Surgery    Procedures: Cholecystectomy    Antimicrobials:  Anti-infectives (From admission, onward)   Start     Dose/Rate Route Frequency Ordered Stop   11/22/17 0600  cefTRIAXone (ROCEPHIN) 2 g in sodium chloride 0.9 % 100 mL IVPB     2 g 200 mL/hr over 30 Minutes Intravenous On call to O.R. 11/21/17 1627 11/23/17 0559     Subjective: Seen and examined at bedside and was difficult to understand and spoke very little [place].  States he is vomited this morning though and continues to have abdominal pain.  No chest pain or shortness of breath.  No other concerns or complaints at this time and ready to go for cholecystectomy.  Objective: Vitals:   11/21/17 2123 11/22/17 0539 11/22/17 0744 11/22/17 0816  BP: (!) 157/90 (!) 148/94 (!) 160/90   Pulse: 93 97    Resp: 16 18    Temp: 99.4 F (37.4 C) 98 F (36.7 C)    TempSrc: Oral Oral    SpO2: 99% 99%  96%  Weight:      Height:        Intake/Output Summary (Last 24 hours) at 11/22/2017 1256 Last data filed at 11/22/2017 1610 Gross per 24 hour  Intake 682.72 ml  Output 550 ml  Net 132.72 ml   Filed Weights   11/18/17 1500  Weight: 65.3 kg   Examination: Physical Exam:  Constitutional: Thin Male NAD and appears calm and comfortable Eyes:  Lids and conjunctivae normal, sclerae anicteric  ENMT: External Ears, Nose appear normal. Grossly normal hearing. Mucous membranes are moist.  Neck: Appears normal, supple, no cervical masses, normal ROM, no appreciable thyromegaly Respiratory: Diminished to auscultation bilaterally, no wheezing, rales, rhonchi or crackles. Normal respiratory effort and patient is not tachypenic. No accessory muscle use.  Cardiovascular: RRR, no murmurs / rubs / gallops. S1 and S2 auscultated. No extremity edema Abdomen: Soft,tender to palpate, non-distended. No masses palpated. No appreciable hepatosplenomegaly. Bowel sounds positive x4.   GU: Deferred. Musculoskeletal: No clubbing / cyanosis of digits/nails. No joint deformity upper and lower extremities.  Skin: No rashes, lesions, ulcers on a limited skin evaluation. No induration; Warm and dry.  Neurologic: CN 2-12 grossly intact with no focal deficits. Romberg sign and cerebellar reflexes not assessed.  Psychiatric: Normal judgment and insight. Alert and oriented x 3. Normal mood and appropriate affect.   Data Reviewed: I have personally reviewed following labs and imaging studies  CBC: Recent Labs  Lab 11/18/17 0553  11/19/17 0800 11/19/17 1804 11/20/17 0833 11/20/17 1747 11/22/17 0935  WBC 7.2   < > 8.9 8.3 7.1 8.5 6.0  NEUTROABS 4.4  --   --   --   --   --  3.5  HGB 13.3   < > 12.3* 11.3* 11.3* 11.8* 11.4*  HCT 39.2   < > 37.0* 33.8* 35.0* 36.1* 34.9*  MCV 87.1   < > 87.5 87.3 90.2 89.8 89.7  PLT 228   < > 221 213 203 219 192   < > =  values in this interval not displayed.   Basic Metabolic Panel: Recent Labs  Lab 11/18/17 0553 11/19/17 0800 11/21/17 1249 11/22/17 0935  NA 136 141 138 136  K 4.0 4.0 3.8 3.7  CL 99 105 106 101  CO2 24 25 26 28   GLUCOSE 289* 189* 166* 117*  BUN 33* 24* 19 14  CREATININE 2.41* 2.06* 2.12* 2.03*  CALCIUM 9.1 8.4* 8.3* 8.2*  MG  --   --   --  1.5*  PHOS  --   --   --  2.6   GFR: Estimated Creatinine Clearance: 42 mL/min (A) (by C-G formula based on SCr of 2.03 mg/dL (H)). Liver Function Tests: Recent Labs  Lab 11/18/17 0553 11/21/17 1249 11/22/17 0935  AST 22 17 20   ALT 16 14 12   ALKPHOS 51 39 38  BILITOT 1.1 0.9 1.0  PROT 7.2 5.8* 5.8*  ALBUMIN 4.1 3.3* 3.5   Recent Labs  Lab 11/18/17 0553  LIPASE 48   No results for input(s): AMMONIA in the last 168 hours. Coagulation Profile: Recent Labs  Lab 11/18/17 0553  INR 1.15   Cardiac Enzymes: Recent Labs  Lab 11/18/17 0553  TROPONINI <0.03   BNP (last 3 results) No results for input(s): PROBNP in the last 8760 hours. HbA1C: Recent Labs     11/20/17 0833  HGBA1C 8.5*   CBG: Recent Labs  Lab 11/21/17 0954 11/21/17 1304 11/21/17 1701 11/21/17 2125 11/22/17 0801  GLUCAP 185* 146* 238* 242* 126*   Lipid Profile: No results for input(s): CHOL, HDL, LDLCALC, TRIG, CHOLHDL, LDLDIRECT in the last 72 hours. Thyroid Function Tests: No results for input(s): TSH, T4TOTAL, FREET4, T3FREE, THYROIDAB in the last 72 hours. Anemia Panel: No results for input(s): VITAMINB12, FOLATE, FERRITIN, TIBC, IRON, RETICCTPCT in the last 72 hours. Sepsis Labs: No results for input(s): PROCALCITON, LATICACIDVEN in the last 168 hours.  Recent Results (from the past 240 hour(s))  Surgical pcr screen     Status: Abnormal   Collection Time: 11/22/17  6:03 AM  Result Value Ref Range Status   MRSA, PCR NEGATIVE NEGATIVE Final   Staphylococcus aureus POSITIVE (A) NEGATIVE Final    Comment: (NOTE) The Xpert SA Assay (FDA approved for NASAL specimens in patients 39 years of age and older), is one component of a comprehensive surveillance program. It is not intended to diagnose infection nor to guide or monitor treatment. Performed at [facility], [address], [place], [place] [address]     Radiology Studies: Nm Hepato W/eject Fract  Result Date: 11/20/2017 CLINICAL DATA:  RIGHT upper quadrant pain with nausea and vomiting for 2 days, chills, question fever, history diabetes mellitus, hypertension EXAM: NUCLEAR MEDICINE HEPATOBILIARY IMAGING WITH GALLBLADDER EF TECHNIQUE: Sequential images of the abdomen were obtained out to 60 minutes following intravenous administration of radiopharmaceutical. After oral ingestion of Ensure, gallbladder ejection fraction was determined. At 60 min, normal ejection fraction is greater than 33%. RADIOPHARMACEUTICALS:  5.3 mCi Tc-84m  Choletec IV COMPARISON:  None FINDINGS: Normal tracer extraction from bloodstream indicating normal hepatocellular function. Normal excretion of tracer into biliary  tree. Gallbladder visualized at 24 min. Small bowel not visualized until following fatty meal stimulation No hepatic retention of tracer. Subjectively decreased emptying of tracer from gallbladder following fatty meal stimulation. Calculated gallbladder ejection fraction is 7%, abnormally low. Patient reported no symptoms following Ensure ingestion. Normal gallbladder ejection fraction following Ensure ingestion is greater than 33% at 1 hour. IMPRESSION: Patent biliary tree without scintigraphic evidence  of acute cholecystitis. Abnormal gallbladder response to fatty meal stimulation with a markedly decreased gallbladder ejection fraction of 7%. Electronically Signed   By: Ulyses SouthwardMark  Boles M.D.   On: 11/20/2017 18:07   Scheduled Meds: . [MAR Hold] insulin aspart  0-5 Units Subcutaneous QHS  . [MAR Hold] insulin aspart  0-9 Units Subcutaneous TID WC  . [MAR Hold] insulin glargine  8 Units Subcutaneous QHS  . [MAR Hold] metoCLOPramide (REGLAN) injection  10 mg Intravenous Q6H  . [MAR Hold] ondansetron  4 mg Oral QID   Or  . [MAR Hold] ondansetron (ZOFRAN) IV  4 mg Intravenous QID  . [MAR Hold] pantoprazole (PROTONIX) IV  40 mg Intravenous Q12H   Continuous Infusions: . sodium chloride 50 mL/hr at 11/22/17 0755  . cefTRIAXone (ROCEPHIN)  IV    . lactated ringers 20 mL/hr at 11/22/17 1214    LOS: 4 days   Merlene Laughtermair Latif Sheikh, DO [facility] PAGER is on AMION  If 7PM-7AM, please contact night-coverage [email or web address] Password [email or web address] 11/22/2017, 12:56 PM

## 2017-11-22 NOTE — Op Note (Signed)
11/22/2017  PATIENT:  Thomas Haley  47 y.o. male  Patient Care Team: Patient, No Pcp Per as PCP - General (General Practice)  PRE-OPERATIVE DIAGNOSIS:    Chronic Cholecystitis  POST-OPERATIVE DIAGNOSIS:   Chronic Cholecystitis  Liver: normal  PROCEDURE:  SINGLE SITE Laparoscopic cholecystectomy with intraoperative cholangiogram  SURGEON:  Ardeth SportsmanSteven C. Tallia Moehring, MD, FACS.  ASSISTANT: Sherrie GeorgeWillard Jennings, PA-C  ANESTHESIA:    General with endotracheal intubation Local anesthetic as a field block  EBL:  (See Anesthesia Intraoperative Record) Total I/O In: 244.2 [I.V.:244.2] Out: 250 [Urine:250]  Delay start of Pharmacological VTE agent (>24hrs) due to surgical blood loss or risk of bleeding:  no  DRAINS: None   SPECIMEN: Gallbladder    DISPOSITION OF SPECIMEN:  PATHOLOGY  COUNTS:  YES  PLAN OF CARE: Admit to inpatient   PATIENT DISPOSITION:  PACU - hemodynamically stable.  INDICATION: [place].  History of diabetes and hypertension that have been a challenge to control.  Intermittent abdominal pain and nausea vomiting.  Initially soon to be gastroparesis.  However repeated attacks with better diabetic control and symptoms were concerning for biliary colic.  No gallstones but HIDA scan shows markedly decreased gallbladder ejection fraction with reproduction of symptoms.  Suspicious for chronic acalculous cholecystitis most likely due to partial cystic duct obstruction.  The rest of the original diagnosis seems unlikely.  Cholecystectomy offered.  Discussions may numerous times with the help of the Spanish interpreter.  Questions answered he wished to proceed  The anatomy & physiology of hepatobiliary & pancreatic function was discussed.  The pathophysiology of gallbladder dysfunction was discussed.  Natural history risks without surgery was discussed.   I feel the risks of no intervention will lead to serious problems that  outweigh the operative risks; therefore, I recommended cholecystectomy to remove the pathology.  I explained laparoscopic techniques with possible need for an open approach.  Probable cholangiogram to evaluate the bilary tract was explained as well.    Risks such as bleeding, infection, abscess, leak, injury to other organs, need for further treatment, heart attack, death, and other risks were discussed.  I noted a good likelihood this will help address the problem.  Possibility that this will not correct all abdominal symptoms was explained.  Goals of post-operative recovery were discussed as well.  We will work to minimize complications.  An educational handout further explaining the pathology and treatment options was given as well.  Questions were answered.  The patient expresses understanding & wishes to proceed with surgery.  OR FINDINGS: Dilated boggy gallbladder with some edema suspicious for at least chronic cholecystitis.  Some low volume ascites but no evidence of cirrhosis or other abnormalities.  Cholangiogram shows classic biliary system without any cystic duct obstruction or choledocholithiasis.  Liver: normal  DESCRIPTION:   The patient was identified & brought in the operating room. The patient was positioned supine with arms tucked. SCDs were active during the entire case. The patient underwent general anesthesia without any difficulty.  The abdomen was prepped and draped in a sterile fashion. A Surgical Timeout confirmed our plan.  I made a transverse curvilinear incision through the superior umbilical fold.  I placed a 5mm long port through the supraumbilical fascia using a modified Hassan cutdown technique with umbilical stalk fascial countertraction. I began carbon dioxide insufflation.  No change in end tidal CO2 measurement.   Camera inspection revealed no injury. There were no adhesions to the anterior abdominal wall supraumbilically.  I proceeded to continue with single site  technique. I placed a #5 port in left upper aspect of the wound. I placed a 5 mm atraumatic grasper in the right inferior aspect of the wound.  I turned attention to the right upper quadrant.  The gallbladder was distended and boggy with some edema suspicious for at least chronic cholecystitis.  The gallbladder fundus was elevated cephalad. I freed adhesions to the ventral surface of the gallbladder off carefully.  I freed the peritoneal coverings between the gallbladder and the liver on the posteriolateral and anteriomedial walls. I alternated between Harmonic & blunt [place] dissection to help get a good critical view of the cystic artery and cystic duct.  did further dissection to free 50%of the gallbladder off the liver bed to get a good critical view of the infundibulum and cystic duct. I dissected out the cystic artery; and, after getting a good 360 view, ligated the anterior & posterior branches of the cystic artery close on the infundibulum using the Harmonic ultrasonic dissection.  I skeletonized the cystic duct.  I placed a clip on the infundibulum. I did a partial cystic duct-otomy and ensured patency. I placed a 5 [place] cholangiocatheter through a puncture site at the right subcostal ridge of the abdominal wall and directed it into the cystic duct.  We ran a cholangiogram with dilute radio-opaque contrast and continuous fluoroscopy. Contrast flowed from a side branch consistent with cystic duct cannulization. Contrast flowed up the common hepatic duct into the right and left intrahepatic chains out to secondary radicals. Contrast flowed down the common bile duct easily across the normal ampulla into the duodenum.  This was consistent with a normal cholangiogram.  I removed the cholangiocatheter. I placed clips on the cystic duct x4.  I completed cystic duct transection. I freed the gallbladder from its remaining attachments to the liver. I ensured hemostasis on the gallbladder fossa of the liver  and elsewhere. I inspected the rest of the abdomen & detected no injury nor bleeding elsewhere.  I removed the gallbladder out the supraumbilical fascia. I closed the fascia transversely using #1 PDS interrupted stitches. I closed the skin using 4-0 monocryl stitch.  Sterile dressing was applied. The patient was extubated & arrived in the PACU in stable condition..  I had discussed postoperative care with the patient in the holding area.I made an attempt to locate family to discuss patient's status and recommendations.  No one is available at this time.  He declined me offering to call family back in [place].  We will try again later  Ardeth Sportsman, M.D., F.A.C.S. Gastrointestinal and Minimally Invasive Surgery [facility] [address], [address] [place], [place] [address] [phone] Main / Paging  11/22/2017 2:57 PM

## 2017-11-22 NOTE — Discharge Instructions (Addendum)
Please arrive at least 30 min before your appointment to complete your check in paperwork.  If you are unable to arrive 30 min prior to your appointment time we may have to cancel or reschedule you. ° °LAPAROSCOPIC SURGERY: POST OP INSTRUCTIONS  °1. DIET: Follow a light bland diet the first 24 hours after arrival home, such as soup, liquids, crackers, etc. Be sure to include lots of fluids daily. Avoid fast food or heavy meals as your are more likely to get nauseated. Eat a low fat the next few days after surgery.  °2. Take your usually prescribed home medications unless otherwise directed. °3. PAIN CONTROL:  °1. Pain is best controlled by a usual combination of three different methods TOGETHER:  °1. Ice/Heat °2. Over the counter pain medication °3. Prescription pain medication °2. Most patients will experience some swelling and bruising around the incisions. Ice packs or heating pads (30-60 minutes up to 6 times a day) will help. Use ice for the first few days to help decrease swelling and bruising, then switch to heat to help relax tight/sore spots and speed recovery. Some people prefer to use ice alone, heat alone, alternating between ice & heat. Experiment to what works for you. Swelling and bruising can take several weeks to resolve.  °3. It is helpful to take an over-the-counter pain medication regularly for the first few weeks. Choose one of the following that works best for you:  °1. Naproxen (Aleve, etc) Two 220mg tabs twice a day °2. Ibuprofen (Advil, etc) Three 200mg tabs four times a day (every meal & bedtime) °3. Acetaminophen (Tylenol, etc) 500-650mg four times a day (every meal & bedtime) °4. A prescription for pain medication (such as oxycodone, hydrocodone, etc) should be given to you upon discharge. Take your pain medication as prescribed.  °1. If you are having problems/concerns with the prescription medicine (does not control pain, nausea, vomiting, rash, itching, etc), please call us [phone]  [phone] to see if we need to switch you to a different pain medicine that will work better for you and/or control your side effect better. °2. If you need a refill on your pain medication, please contact your pharmacy. They will contact our office to request authorization. Prescriptions will not be filled after 5 pm or on week-ends. °4. Avoid getting constipated. Between the surgery and the pain medications, it is common to experience some constipation. Increasing fluid intake and taking a fiber supplement (such as Metamucil, Citrucel, FiberCon, MiraLax, etc) 1-2 times a day regularly will usually help prevent this problem from occurring. A mild laxative (prune juice, Milk of Magnesia, MiraLax, etc) should be taken according to package directions if there are no bowel movements after 48 hours.  °5. Watch out for diarrhea. If you have many loose bowel movements, simplify your diet to bland foods & liquids for a few days. Stop any stool softeners and decrease your fiber supplement. Switching to mild anti-diarrheal medications (Kayopectate, Pepto Bismol) can help. If this worsens or does not improve, please call us. °6. Wash / shower every day. You may shower over the dressings as they are waterproof. Continue to shower over incision(s) after the dressing is off. If there is glue over the incisions try not to pick it off, let it fall off naturally. °7. Remove your waterproof bandages 2 days after surgery. You may leave the incision open to air. You may replace a dressing/Band-Aid to cover the incision for comfort if you wish.  °8. ACTIVITIES as tolerated:  °  1. You may resume regular (light) daily activities beginning the next day--such as daily self-care, walking, climbing stairs--gradually increasing activities as tolerated. If you can walk 30 minutes without difficulty, it is safe to try more intense activity such as jogging, treadmill, bicycling, low-impact aerobics, swimming, etc. 2. Save the most intensive and  strenuous activity for last such as sit-ups, heavy lifting, contact sports, etc Refrain from any heavy lifting or straining until you are off narcotics for pain control. For the first 2-3 weeks do not lift over 10-15lb.  3. DO NOT PUSH THROUGH PAIN. Let pain be your guide: If it hurts to do something, don't do it. Pain is your body warning you to avoid that activity for another week until the pain goes down. 4. You may drive when you are no longer taking prescription pain medication, you can comfortably wear a seatbelt, and you can safely maneuver your car and apply brakes. 5. You may have sexual intercourse when it is comfortable.  9. FOLLOW UP in our office  1. Please call [facility] at [phone] to set up an appointment to see your surgeon in the office for a follow-up appointment approximately 2-3 weeks after your surgery. 2. Make sure that you call for this appointment the day you arrive home to insure a convenient appointment time.      10. IF YOU HAVE DISABILITY OR FAMILY LEAVE FORMS, BRING THEM TO THE               OFFICE FOR PROCESSING.   WHEN TO CALL US [phone](336) [phone]:  1. Poor pain control 2. Reactions / problems with new medications (rash/itching, nausea, etc)  3. Fever over 101.5 F (38.5 C) 4. Inability to urinate 5. Nausea and/or vomiting 6. Worsening swelling or bruising 7. Continued bleeding from incision. 8. Increased pain, redness, or drainage from the incision  The clinic staff is available to answer your questions during regular business hours (8:30am-5pm). Please dont hesitate to call and ask to speak to one of our nurses for clinical concerns.  If you have a medical emergency, go to the nearest emergency room or call 911.  A [facility] from [facility] is always on call at the [facility]   [facility] [place]  [address], [address], [place], [place] [address] ?  MAIN: [phone] ? TOLL FREE: [phone] ?  FAX [phone]([phone]    [email or web address]     Colecistectoma laparoscpica, cuidados posteriores Laparoscopic Cholecystectomy, Care After Lea esta informacin sobre cmo cuidarse despus del procedimiento. El mdico tambin podr darle indicaciones ms especficas. Si tiene problemas o preguntas, llame al mdico. Siga estas indicaciones en su casa: Cuidado de los cortes de la ciruga (incisiones)   Siga las indicaciones del mdico en lo que respecta al cuidado de los cortes de la [place]. Haga lo siguiente: ? Lvese las manos con agua y jabn antes de Multimedia programmercambiar las vendas (vendaje). Use un desinfectante para manos si no dispone de [place] y [place]. ? Cambie el vendaje como se lo haya indicado el mdico. ? No retire los puntos (suturas), la goma para cerrar la piel o las tiras [place] Groveadhesivas. Tal vez deban dejarse puestos en la piel durante 2semanas o ms tiempo. Si las tiras Concordiaadhesivas se despegan y se enroscan, puede recortar los bordes sueltos. No retire las tiras Agilent Technologiesadhesivas por completo a menos que el mdico lo autorice.  No tome baos de inmersin, no practique natacin ni use el jacuzzi hasta que el mdico lo  autorice. Pregntele al mdico si puede ducharse. Delle Reining solo le permitan tomar baos de [place].  Controle la zona alrededor del corte todos los das para detectar signos de infeccin. Est atento a los siguientes signos: ? Aumento del enrojecimiento, de la hinchazn o del dolor. ? Ms lquido [place]. ? Calor. ? Pus o mal olor. Actividad  No conduzca ni use maquinaria pesada mientras toma analgsicos recetados.  No levante ningn objeto que pese ms de 10libras (4,5kg) hasta que el mdico lo autorice.  No practique deportes de contacto hasta que el mdico lo autorice.  No conduzca durante 24horas si le dieron un medicamento para ayudarlo a que se relaje (sedante).  Descanse todo lo que sea necesario. No retome el trabajo ni el estudio hasta que el mdico lo autorice. Instrucciones  generales  [employer] de venta libre y los recetados solamente como se lo haya indicado el mdico.  A fin de prevenir o tratar el estreimiento mientras toma analgsicos recetados, el mdico puede recomendarle lo siguiente: ? Product manager suficiente lquido para Pharmacologist el pis (orina) claro o de color amarillo plido. ? Tomar medicamentos recetados o de [employer]. ? Consumir alimentos ricos en fibra, como frutas y verduras frescas, cereales integrales y frijoles. ? Limitar el consumo de alimentos con alto contenido de grasas y azcares procesados, como alimentos fritos o dulces. Comunquese con un mdico si:  Le aparece una erupcin cutnea.  Tiene ms enrojecimiento, hinchazn o dolor alrededor [employer] cortes de la [place].  Aumenta la cantidad de lquido o sangre que sale de los cortes.  Los cortes de la ciruga se sienten calientes al tacto.  Tiene pus o percibe mal olor que [employer] cortes.  Tiene fiebre.  Se abren uno o ms de los cortes. Solicite ayuda de inmediato si:  Tiene dificultad para respirar.  Siente dolor en el pecho.  Siente dolor que empeora en la zona de los hombros.  Se desmaya o se siente mareado al ponerse de pie.  Tiene dolor muy intenso de vientre (abdomen).  Tiene malestar estomacal (nuseas) que dura ms de Civil engineer, contracting.  Vomita durante ms de Civil engineer, contracting.  Siente dolor en la pierna. Esta informacin no tiene Theme park manager el consejo del mdico. Asegrese de hacerle al mdico cualquier pregunta que tenga. Document Released: 11/24/2010 Document Revised: 06/20/2016 Document Reviewed: 08/31/2015 Elsevier Interactive Patient Education  [employer].

## 2017-11-22 NOTE — Progress Notes (Signed)
[facility] Progress Note     Subjective: CC-  Feels about the same. Continues to have mild upper abdominal pain associated nausea. States that he vomited green bile earlier this morning.  Objective: Vital signs in last 24 hours: Temp:  [98 F (36.7 C)-99.5 F (37.5 C)] 98 F (36.7 C) (08/28 0539) Pulse Rate:  [88-97] 97 (08/28 0539) Resp:  [16-18] 18 (08/28 0539) BP: (143-170)/(70-94) 160/90 (08/28 0744) SpO2:  [96 %-99 %] 96 % (08/28 0816) Last BM Date: 11/19/17  Intake/Output from previous day: 08/27 0701 - 08/28 0700 In: 438.5 [I.V.:438.5] Out: 1000 [Urine:1000] Intake/Output this shift: Total I/O In: 244.2 [I.V.:244.2] Out: 250 [Urine:250]  PE: Gen:  Alert, NAD, pleasant HEENT: EOM's intact, pupils equal and round Card:  RRR Pulm:  CTAB, no W/R/R, effort normal Abd: Soft, ND, mild TTP epigastric region, +BS, RIH soft and reducible  Ext:  Calves soft and nontender Psych: A&Ox3  Skin: no rashes noted, warm and dry  Lab Results:  Recent Labs    11/20/17 0833 11/20/17 1747  WBC 7.1 8.5  HGB 11.3* 11.8*  HCT 35.0* 36.1*  PLT 203 219   BMET Recent Labs    11/21/17 1249  NA 138  K 3.8  CL 106  CO2 26  GLUCOSE 166*  BUN 19  CREATININE 2.12*  CALCIUM 8.3*   PT/INR No results for input(s): LABPROT, INR in the last 72 hours. CMP     Component Value Date/Time   NA 138 11/21/2017 1249   K 3.8 11/21/2017 1249   CL 106 11/21/2017 1249   CO2 26 11/21/2017 1249   GLUCOSE 166 (H) 11/21/2017 1249   BUN 19 11/21/2017 1249   CREATININE 2.12 (H) 11/21/2017 1249   CALCIUM 8.3 (L) 11/21/2017 1249   PROT 5.8 (L) 11/21/2017 1249   ALBUMIN 3.3 (L) 11/21/2017 1249   AST 17 11/21/2017 1249   ALT 14 11/21/2017 1249   ALKPHOS 39 11/21/2017 1249   BILITOT 0.9 11/21/2017 1249   GFRNONAA 36 (L) 11/21/2017 1249   GFRAA 41 (L) 11/21/2017 1249   Lipase     Component Value Date/Time   LIPASE 48 11/18/2017 0553       Studies/Results: Nm  Hepato W/eject Fract  Result Date: 11/20/2017 CLINICAL DATA:  RIGHT upper quadrant pain with nausea and vomiting for 2 days, chills, question fever, history diabetes mellitus, hypertension EXAM: NUCLEAR MEDICINE HEPATOBILIARY IMAGING WITH GALLBLADDER EF TECHNIQUE: Sequential images of the abdomen were obtained out to 60 minutes following intravenous administration of radiopharmaceutical. After oral ingestion of Ensure, gallbladder ejection fraction was determined. At 60 min, normal ejection fraction is greater than 33%. RADIOPHARMACEUTICALS:  5.3 mCi Tc-34m  Choletec IV COMPARISON:  None FINDINGS: Normal tracer extraction from bloodstream indicating normal hepatocellular function. Normal excretion of tracer into biliary tree. Gallbladder visualized at 24 min. Small bowel not visualized until following fatty meal stimulation No hepatic retention of tracer. Subjectively decreased emptying of tracer from gallbladder following fatty meal stimulation. Calculated gallbladder ejection fraction is 7%, abnormally low. Patient reported no symptoms following Ensure ingestion. Normal gallbladder ejection fraction following Ensure ingestion is greater than 33% at 1 hour. IMPRESSION: Patent biliary tree without scintigraphic evidence of acute cholecystitis. Abnormal gallbladder response to fatty meal stimulation with a markedly decreased gallbladder ejection fraction of 7%. Electronically Signed   By: Ulyses Southward M.D.   On: 11/20/2017 18:07    Anti-infectives: Anti-infectives (From admission, onward)   Start     Dose/Rate Route Frequency Ordered  Stop   11/22/17 0600  cefTRIAXone (ROCEPHIN) 2 g in sodium chloride 0.9 % 100 mL IVPB     2 g 200 mL/hr over 30 Minutes Intravenous On call to O.R. 11/21/17 1627 11/23/17 0559       Assessment/Plan HTN DM, uncontrolled - A1c 8.5 Acute on ?chronic kidney disease Chronic constipation Right inguinal hernia - soft and reducible, can f/u outpatient to discuss surgical  repair H/o tracheostomy/PEG ~2017 - since removed  Abdominal pain, nausea, vomiting Biliary dyskinesia - Workup for abdominal pain, nausea, and vomiting included negative CT scan abdomen/pelvis, EGD at outside hospital, and lab work - HIDA positive for biliary dyskinesia with EF 7% - also on the differential gastroparesis 2/2 diabetes  ID - rocephin on call to OR VTE - SCDs FEN - IVF, NPO Foley - none  Plan - Labs pending. Plan for laparoscopic cholecystectomy today. Keep NPO.   LOS: 4 days    Franne FortsBrooke A Meuth , [facility] 11/22/2017, 9:54 AM Pager: [phone] Consults: [phone] Mon 7:00 am -11:30 AM Tues-Fri 7:00 am-4:30 pm Sat-Sun 7:00 am-11:30 am

## 2017-11-23 ENCOUNTER — Encounter (HOSPITAL_COMMUNITY): Payer: Self-pay | Admitting: Surgery

## 2017-11-23 DIAGNOSIS — E1129 Type 2 diabetes mellitus with other diabetic kidney complication: Secondary | ICD-10-CM

## 2017-11-23 DIAGNOSIS — N179 Acute kidney failure, unspecified: Secondary | ICD-10-CM

## 2017-11-23 DIAGNOSIS — E1165 Type 2 diabetes mellitus with hyperglycemia: Secondary | ICD-10-CM

## 2017-11-23 DIAGNOSIS — K811 Chronic cholecystitis: Principal | ICD-10-CM

## 2017-11-23 DIAGNOSIS — N183 Chronic kidney disease, stage 3 (moderate): Secondary | ICD-10-CM

## 2017-11-23 DIAGNOSIS — Z789 Other specified health status: Secondary | ICD-10-CM

## 2017-11-23 LAB — CBC WITH DIFFERENTIAL/PLATELET
Basophils Absolute: 0 10*3/uL (ref 0.0–0.1)
Basophils Relative: 0 %
EOS PCT: 0 %
Eosinophils Absolute: 0 10*3/uL (ref 0.0–0.7)
HEMATOCRIT: 31.5 % — AB (ref 39.0–52.0)
Hemoglobin: 10.3 g/dL — ABNORMAL LOW (ref 13.0–17.0)
LYMPHS PCT: 15 %
Lymphs Abs: 1.4 10*3/uL (ref 0.7–4.0)
MCH: 29.3 pg (ref 26.0–34.0)
MCHC: 32.7 g/dL (ref 30.0–36.0)
MCV: 89.5 fL (ref 78.0–100.0)
MONO ABS: 1.1 10*3/uL — AB (ref 0.1–1.0)
Monocytes Relative: 12 %
NEUTROS ABS: 6.6 10*3/uL (ref 1.7–7.7)
Neutrophils Relative %: 73 %
PLATELETS: 188 10*3/uL (ref 150–400)
RBC: 3.52 MIL/uL — ABNORMAL LOW (ref 4.22–5.81)
RDW: 13.4 % (ref 11.5–15.5)
WBC: 9.2 10*3/uL (ref 4.0–10.5)

## 2017-11-23 LAB — COMPREHENSIVE METABOLIC PANEL
ALK PHOS: 37 U/L — AB (ref 38–126)
ALT: 24 U/L (ref 0–44)
AST: 24 U/L (ref 15–41)
Albumin: 2.9 g/dL — ABNORMAL LOW (ref 3.5–5.0)
Anion gap: 8 (ref 5–15)
BILIRUBIN TOTAL: 0.7 mg/dL (ref 0.3–1.2)
BUN: 24 mg/dL — AB (ref 6–20)
CALCIUM: 7.9 mg/dL — AB (ref 8.9–10.3)
CO2: 24 mmol/L (ref 22–32)
CREATININE: 2.3 mg/dL — AB (ref 0.61–1.24)
Chloride: 98 mmol/L (ref 98–111)
GFR calc Af Amer: 37 mL/min — ABNORMAL LOW (ref >60)
GFR, EST NON AFRICAN AMERICAN: 32 mL/min — AB (ref >60)
Glucose, Bld: 276 mg/dL — ABNORMAL HIGH (ref 70–99)
Potassium: 4.4 mmol/L (ref 3.5–5.1)
Sodium: 130 mmol/L — ABNORMAL LOW (ref 135–145)
Total Protein: 5.3 g/dL — ABNORMAL LOW (ref 6.5–8.1)

## 2017-11-23 LAB — GLUCOSE, CAPILLARY
Glucose-Capillary: 215 mg/dL — ABNORMAL HIGH (ref 70–99)
Glucose-Capillary: 85 mg/dL (ref 70–99)

## 2017-11-23 LAB — BASIC METABOLIC PANEL
Anion gap: 9 (ref 5–15)
BUN: 22 mg/dL — ABNORMAL HIGH (ref 6–20)
CHLORIDE: 99 mmol/L (ref 98–111)
CO2: 23 mmol/L (ref 22–32)
Calcium: 7.9 mg/dL — ABNORMAL LOW (ref 8.9–10.3)
Creatinine, Ser: 2.21 mg/dL — ABNORMAL HIGH (ref 0.61–1.24)
GFR calc Af Amer: 39 mL/min — ABNORMAL LOW (ref >60)
GFR calc non Af Amer: 34 mL/min — ABNORMAL LOW (ref >60)
GLUCOSE: 92 mg/dL (ref 70–99)
Potassium: 4.1 mmol/L (ref 3.5–5.1)
Sodium: 131 mmol/L — ABNORMAL LOW (ref 135–145)

## 2017-11-23 LAB — MAGNESIUM: MAGNESIUM: 2 mg/dL (ref 1.7–2.4)

## 2017-11-23 LAB — PHOSPHORUS: Phosphorus: 3.6 mg/dL (ref 2.5–4.6)

## 2017-11-23 MED ORDER — SENNOSIDES-DOCUSATE SODIUM 8.6-50 MG PO TABS
1.0000 | ORAL_TABLET | Freq: Two times a day (BID) | ORAL | Status: DC
  Administered 2017-11-23: 1 via ORAL
  Filled 2017-11-23: qty 1

## 2017-11-23 MED ORDER — SODIUM CHLORIDE 0.9 % IV SOLN
INTRAVENOUS | Status: DC
  Administered 2017-11-23: 10:00:00 via INTRAVENOUS

## 2017-11-23 MED ORDER — POLYETHYLENE GLYCOL 3350 17 G PO PACK: 17.0000 g | PACK | Freq: Every day | ORAL | Status: DC

## 2017-11-23 MED ORDER — POLYETHYLENE GLYCOL 3350 17 G PO PACK: 17.0000 g | PACK | Freq: Two times a day (BID) | ORAL | 0 refills | Status: AC

## 2017-11-23 MED ORDER — METOCLOPRAMIDE HCL 5 MG/ML IJ SOLN: 10.0000 mg | Freq: Four times a day (QID) | INTRAMUSCULAR | Status: DC | PRN

## 2017-11-23 MED ORDER — POLYETHYLENE GLYCOL 3350 17 G PO PACK: 17.0000 g | PACK | Freq: Every day | ORAL | 0 refills | Status: AC

## 2017-11-23 MED ORDER — BISACODYL 10 MG RE SUPP
10.0000 mg | Freq: Once | RECTAL | Status: AC
  Administered 2017-11-23: 10 mg via RECTAL
  Filled 2017-11-23: qty 1

## 2017-11-23 MED ORDER — OXYCODONE HCL 5 MG PO TABS: 5.0000 mg | ORAL_TABLET | Freq: Four times a day (QID) | ORAL | 0 refills | Status: AC | PRN

## 2017-11-23 MED ORDER — INSULIN GLARGINE 100 UNIT/ML ~~LOC~~ SOLN
15.0000 [IU] | Freq: Every day | SUBCUTANEOUS | Status: DC
  Filled 2017-11-23: qty 0.15

## 2017-11-23 MED ORDER — SENNOSIDES-DOCUSATE SODIUM 8.6-50 MG PO TABS: 1.0000 | ORAL_TABLET | Freq: Every evening | ORAL | 0 refills | Status: AC | PRN

## 2017-11-23 MED ORDER — POLYETHYLENE GLYCOL 3350 17 G PO PACK
17.0000 g | PACK | Freq: Two times a day (BID) | ORAL | Status: DC
  Administered 2017-11-23: 17 g via ORAL
  Filled 2017-11-23: qty 1

## 2017-11-23 MED ORDER — SODIUM CHLORIDE 0.9 % IV BOLUS
500.0000 mL | Freq: Once | INTRAVENOUS | Status: AC
  Administered 2017-11-23: 500 mL via INTRAVENOUS

## 2017-11-23 MED ORDER — INSULIN GLARGINE 100 UNIT/ML ~~LOC~~ SOLN: 15.0000 [IU] | Freq: Every day | SUBCUTANEOUS | 11 refills | Status: AC

## 2017-11-23 MED ORDER — DOCUSATE SODIUM 100 MG PO CAPS: 100.0000 mg | ORAL_CAPSULE | Freq: Two times a day (BID) | ORAL | 0 refills | Status: AC

## 2017-11-23 MED ORDER — PEN NEEDLES 31G X 5 MM MISC: 1.0000 | Freq: Every day | 0 refills | Status: AC

## 2017-11-23 MED ORDER — DOCUSATE SODIUM 100 MG PO CAPS: 100.0000 mg | ORAL_CAPSULE | Freq: Two times a day (BID) | ORAL | Status: DC

## 2017-11-23 MED ORDER — INSULIN ASPART 100 UNIT/ML ~~LOC~~ SOLN: 4.0000 [IU] | Freq: Three times a day (TID) | SUBCUTANEOUS | Status: DC

## 2017-11-23 MED ORDER — BLOOD GLUCOSE MONITOR KIT: PACK | 0 refills | Status: AC

## 2017-11-23 MED ORDER — ACETAMINOPHEN 500 MG PO TABS: 1000.0000 mg | ORAL_TABLET | Freq: Three times a day (TID) | ORAL | 0 refills | Status: AC | PRN

## 2017-11-23 NOTE — Discharge Summary (Signed)
Physician Discharge Summary  Thomas Haley ZOX:[id number] DOB: 29-Jan-1971 DOA: 11/18/2017  PCP: Patient, No Pcp Per  Admit date: 11/18/2017 Discharge date: 11/23/2017  Admitted From: Home Disposition: Home  Recommendations for Outpatient Follow-up:  1. Follow up with and establish with local PCP in 1-2 weeks 2. Follow up with General Surgery as scheduled on 9/12 3. Follow up with Outpatient Diabetes Coordinator 4. Please obtain CMP/CBC, Mag, Phos in one week 5. Please follow up on the following pending results:  Home Health: No Equipment/Devices: None  Discharge Condition: Stable CODE STATUS: FULL CODE Diet recommendation: Heart Healthy Carb Modified Diet  Brief/Interim Summary: The patient is a 47 y.o.malewith medical history of Insulin dependentDM for the past 5 yrs, HTN and other comorbids who presents to the ED for vomiting and abdominal pain. 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   He was admitted for vomiting and abdominal pain at [facility] recently and discharged the day before Admission at [facility]. He states he was told his vomiting may be from his diabetes and was discharged with some medications that he has not yet had a chance to pick up. He has had ongoing vomiting and comes back to the ED. He stated that there is a large amount of blood in his vomitus as well.   He last vomited around 8 AM. He has pain in his mid abdomen which is a 7/10 right now and does not radiate. Pain medictaions make it better. He has not had any diarrhea. Stool has been brown and not bloody. No fever or chills. He states that he has had these symptoms for about 1 wk now and prior to this, he has never had any GI symptoms or weight loss. He does not drink alcohol.   He takes his insulin apprpriately and his diabetes "numbers" were about 7.5 when checked 3 months ago.  He was admitted for his Abdominal Pain, Nausea, and Vomiting  and GI was consulted. HIDA scan was abnormal and showed EF of 7%. General Surgery was consulted and took the patient for Cholecystectomy and he has improved. He was deemed medically stable to D/C Home and will need to follow up with and establish with PCP and follow up with General Surgery in the outpatient setting.   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  -Overnight 11/21/17 he received multiple doses of Zofranand Reglan.  -He still complained of abdominal pain yesterday but improved today and complains of abdominal soreness. He has not had any further hematemesis.  -Diet Advanced to Heart Healthy Carb Modified Diet -HIDA scanshows decreased EF 7 % -GI Consulted and reviewed EGD done by Dr. Franchot Heidelberg at [facility] and was normal but showed 2 cm Hiatial Hernia -GI not planning any further workup given stable Hb and resolved hematemesis and recommened Surgical consultation for likely Biliary Dyskinesia for EF of 7% -General  Surgery taking the patient for Cholecystectomy and he his POD 1 -Continued IV Metaclopramide and IV Pantoprazole q12h and will change Pantoprazole to po -Continue to Monitor after Cholecystectomy and is doing well and tolerating diet and ambulating  -Follow up with General Surgery as an outpatient  -Follow up and establish with PCP as an outpatient    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  -Patient started on low-dose Lantus at 8 units nightly and increased to 15 units qHS. Along with Sensitive Novolog SSI AC/HS ; Also added 4 units of Novolog Premeal Insulin -Blood sugars have been stable and ranging from 85-290.  -He takes Lantus 15 units at home along with 5 units of NovoLog 3 times a day with meals and will write another script for Lantus at D/C along with a Glucometer  -Continue to Monitor Carefully as an outpatient -Will make an Ambulatory Referral to Outpatient Diabetes Coordinator   Uncontrolled Hypertension  -has been impving on IV labetalol and hydralazine. -C/w PRN Hydralazine and PRN Labetalol  -Patient's BP was 160/90 -Resume Home HCTZ and Home Cardizem   AKI but suspect that patient has CKD Stage 3 and likley from Diabetes  -Initially it was thought he had an AKI that was likley secondary to prerenal dehydration secondary to decreased p.o. intake and intractable nausea and vomiting. -BUN/Cr went from 33/2.41 -> 14/2.03 -> 24/2.30 -> 22/2.21 -Changed IVF to NS at 75 mL today and gave Bolus today -Avoid Nephrotoxic medications if possible -Repeat CMP in AM   Hypomagnesemia  -Patient's Mag Level was 1.5 and repeat this AM was 2.0 -Replete with IV Mag Sulfate 2 grams yesterday -Continue to Monitor and Replete as Necessary  -Repeat Mag Level in AM   Normocytic Anemia -Patient's Hb/Hct went from 11.3/35.0 -> 11.8/36.1 -> 11.4/34.9 -> 10.3/31.5 -Resume Home Ferrous Sulfate -Continue to monitor for S/Sx of Bleeding -Check Anemia Panel as an outpatient  -Repeat CBC in AM   Hyponatremia -Patient's Na+ was 131 -Changed IVF from 1/2 NS to NS at 75 mL and gave Bolus -Repeat CMP as an outpatient  Constipation -Patient states he is chronically constipated -Given Bisacodyl Suppository -Started Miralax and Senna-Docusate BID -Continue at D/C  Discharge Instructions Discharge  Instructions    Ambulatory referral to Nutrition and Diabetic Education   Complete by:  As directed    Diet - low sodium heart healthy   Complete by:  As directed    Diet Carb Modified   Complete by:  As directed    Discharge instructions   Complete by:  As directed    You were cared for by a hospitalist during your hospital stay. If you have any questions about your discharge medications or the care you received while you were in the hospital after you are discharged, you can call the unit and ask to speak with the hospitalist on call if the hospitalist that took care of you is not available. Once you are discharged, your primary care physician will handle any further medical issues. Please note that NO REFILLS for any discharge medications will be authorized once you are discharged, as it is imperative that you return to your primary care physician (or establish a relationship with a primary care physician if you do not have one) for your aftercare needs so that they can reassess your need for medications and monitor your lab values.  Follow up with and establish PCP and General Surgery. Take all medications as prescribed and resume Home  Insulin dosing and follow up with PCP for closer Blood Sugar Management. If symptoms change or worsen please return to the ED for evaluation   Increase activity slowly   Complete by:  As directed      Allergies as of 11/23/2017      Reactions   Shellfish Allergy       Medication List    TAKE these medications   acetaminophen 500 MG tablet Commonly known as:  TYLENOL Take 2 tablets (1,000 mg total) by mouth every 8 (eight) hours as needed for mild pain.   blood glucose meter kit and supplies Kit Dispense based on patient and insurance preference. Use up to four times daily as directed. (FOR ICD-9 250.00, 250.01).   diltiazem 90 MG 12 hr capsule Commonly known as:  CARDIZEM SR Take 90 mg by mouth 2 times daily at 12 noon and 4 pm.   docusate sodium  100 MG capsule Commonly known as:  COLACE Take 1 capsule (100 mg total) by mouth 2 (two) times daily.   ferrous sulfate 325 (65 FE) MG tablet Take 325 mg by mouth daily with breakfast.   hydrochlorothiazide 12.5 MG capsule Commonly known as:  MICROZIDE Take 12.5 mg by mouth daily.   insulin glargine 100 UNIT/ML injection Commonly known as:  LANTUS Inject 0.15 mLs (15 Units total) into the skin at bedtime.   oxyCODONE 5 MG immediate release tablet Commonly known as:  Oxy IR/ROXICODONE Take 1 tablet (5 mg total) by mouth every 6 (six) hours as needed for severe pain.   Pen Needles 31G X 5 MM Misc 1 Container by Does not apply route at bedtime.   polyethylene glycol packet Commonly known as:  MIRALAX / GLYCOLAX Take 17 g by mouth daily.   polyethylene glycol packet Commonly known as:  MIRALAX / GLYCOLAX Take 17 g by mouth 2 (two) times daily.   senna-docusate 8.6-50 MG tablet Commonly known as:  Senokot-S Take 1 tablet by mouth at bedtime as needed for mild constipation.      Follow-up [facility] [place]. Go on 12/07/2017.   Specialty:  General Surgery Why:  Your appointment is 09/12 at 1:30 pm Please arrive 30 minutes prior to your appointment to check in and fill out paperwork. Bring photo ID and insurance information. Contact information: [place] [phone]         Allergies  Allergen Reactions  . Shellfish Allergy    Consultations:  Gastroenterology  General Surgery  Procedures/Studies: Ct Abdomen Pelvis Wo Contrast  Result Date: 11/18/2017 CLINICAL DATA:  Acute epigastric abdominal pain. EXAM: CT ABDOMEN AND PELVIS WITHOUT CONTRAST TECHNIQUE: Multidetector CT imaging of the abdomen and pelvis was performed following the standard protocol without IV contrast. COMPARISON:  None. FINDINGS: Lower chest: No acute abnormality. Hepatobiliary: No focal liver abnormality is  seen. No gallstones, gallbladder wall thickening, or biliary dilatation. Pancreas: Unremarkable. No pancreatic ductal dilatation or surrounding inflammatory changes. Spleen: Normal in size without focal abnormality. Adrenals/Urinary Tract: Adrenal glands appear normal. Bilateral nonobstructive nephrolithiasis is noted. Largest calculus measures 7 mm in lower pole collecting system of right kidney. No hydronephrosis or renal obstruction is noted. Mild urinary bladder distention is noted. Stomach/Bowel: Stomach is within normal limits. Appendix appears normal. No evidence of bowel wall thickening, distention, or inflammatory changes. Diverticulosis of descending and sigmoid colon is noted without inflammation. Vascular/Lymphatic: No significant vascular findings are present. No enlarged abdominal or pelvic  lymph nodes. Reproductive: Mild prostatic enlargement is noted. Other: No abdominal wall hernia or abnormality. No abdominopelvic ascites. Musculoskeletal: No acute or significant osseous findings. IMPRESSION: Bilateral nonobstructive nephrolithiasis. No hydronephrosis or renal obstruction is noted. Diverticulosis of descending and sigmoid colon is noted without inflammation. Mild prostatic enlargement. Electronically Signed   By: Marijo Conception, M.D.   On: 11/18/2017 08:59   Dg Cholangiogram Operative  Result Date: 11/22/2017 CLINICAL DATA:  Cholecystectomy. EXAM: INTRAOPERATIVE CHOLANGIOGRAM TECHNIQUE: Cholangiographic images from the C-arm fluoroscopic device were submitted for interpretation post-operatively. Please see the procedural report for the amount of contrast and the fluoroscopy time utilized. COMPARISON:  Nuclear medicine hepatobiliary study on 11/20/2017 FINDINGS: Intraoperative imaging demonstrates normal opacified biliary tree without evidence of obstruction or filling defect. Contrast enters the duodenum normally. IMPRESSION: Normal intraoperative cholangiogram. Electronically Signed   By:  Aletta Edouard M.D.   On: 11/22/2017 15:00   Nm Hepato W/eject Fract  Result Date: 11/20/2017 CLINICAL DATA:  RIGHT upper quadrant pain with nausea and vomiting for 2 days, chills, question fever, history diabetes mellitus, hypertension EXAM: NUCLEAR MEDICINE HEPATOBILIARY IMAGING WITH GALLBLADDER EF TECHNIQUE: Sequential images of the abdomen were obtained out to 60 minutes following intravenous administration of radiopharmaceutical. After oral ingestion of Ensure, gallbladder ejection fraction was determined. At 60 min, normal ejection fraction is greater than 33%. RADIOPHARMACEUTICALS:  5.3 mCi Tc-26m Choletec IV COMPARISON:  None FINDINGS: Normal tracer extraction from bloodstream indicating normal hepatocellular function. Normal excretion of tracer into biliary tree. Gallbladder visualized at 24 min. Small bowel not visualized until following fatty meal stimulation No hepatic retention of tracer. Subjectively decreased emptying of tracer from gallbladder following fatty meal stimulation. Calculated gallbladder ejection fraction is 7%, abnormally low. Patient reported no symptoms following Ensure ingestion. Normal gallbladder ejection fraction following Ensure ingestion is greater than 33% at 1 hour. IMPRESSION: Patent biliary tree without scintigraphic evidence of acute cholecystitis. Abnormal gallbladder response to fatty meal stimulation with a markedly decreased gallbladder ejection fraction of 7%. Electronically Signed   By: MLavonia DanaM.D.   On: 11/20/2017 18:07   Dg Abdomen Acute W/chest  Result Date: 11/18/2017 CLINICAL DATA:  Abdominal pain, nausea and vomiting. Shortness of breath. EXAM: DG ABDOMEN ACUTE W/ 1V CHEST COMPARISON:  None. FINDINGS: The cardiomediastinal contours are normal. The lungs are hyperinflated but clear. There is no free intra-abdominal air. No dilated bowel loops to suggest obstruction. Slight increased air throughout normal caliber small bowel in the right abdomen.  Small volume of stool throughout the colon. No radiopaque calculi. No acute osseous abnormalities are seen. IMPRESSION: 1. Slight increased air within normal caliber small bowel loops in the right abdomen may represent enteritis or ileus. No obstruction or free air. 2. Hyperinflated but clear lungs. Electronically Signed   By: MJeb LeveringM.D.   On: 11/18/2017 06:48   Subjective: Seen and Examined at bedside with the assistance of translator PMardene [id number] Patient states he had no more nausea vomiting.  States his abdominal pain is improved however he does states that his abdomen is "sore".  Denies any chest pain, shortness breath, lightheadedness or dizziness.  States that he is chronically constipated and has a bowel movement about every 5 days.  Prior to discharge he had a bowel movement after given a suppository and improved.  Patient was deemed medically stable to be discharged and will need to follow-up with and establish with PCP and follow-up with general surgeon outpatient setting.  Discharge Exam: Vitals:   11/23/17  0959 11/23/17 1138  BP: 139/83   Pulse: (!) 110   Resp: 18   Temp: 98.4 F (36.9 C)   SpO2: 97% 96%   Vitals:   11/23/17 0539 11/23/17 0838 11/23/17 0959 11/23/17 1138  BP: (!) 151/89 (!) 152/80 139/83   Pulse: (!) 101 (!) 106 (!) 110   Resp: 18  18   Temp: 98.4 F (36.9 C)  98.4 F (36.9 C)   TempSrc: Oral  Oral   SpO2: 96%  97% 96%  Weight:      Height:       General: Pt is alert, awake, not in acute distress Cardiovascular: RRR, S1/S2 +, no rubs, no gallops Respiratory: CTA bilaterally, no wheezing, no rhonchi Abdominal: Soft, NT, ND, bowel sounds +; Abdominal Incision appeared C/D/I Extremities: no edema, no cyanosis  The results of significant diagnostics from this hospitalization (including imaging, microbiology, ancillary and laboratory) are listed below for reference.    Microbiology: Recent Results (from the past 240 hour(s))  Surgical pcr  screen     Status: Abnormal   Collection Time: 11/22/17  6:03 AM  Result Value Ref Range Status   MRSA, PCR NEGATIVE NEGATIVE Final   Staphylococcus aureus POSITIVE (A) NEGATIVE Final    Comment: (NOTE) The Xpert SA Assay (FDA approved for NASAL specimens in patients 6 years of age and older), is one component of a comprehensive surveillance program. It is not intended to diagnose infection nor to guide or monitor treatment. Performed at [facility], [place] [address], [place], [place] [id number]     Labs: BNP (last 3 results) No results for input(s): BNP in the last 8760 hours. Basic Metabolic Panel: Recent Labs  Lab 11/19/17 0800 11/21/17 1249 11/22/17 0935 11/23/17 0358 11/23/17 1249  NA 141 138 136 130* 131*  K 4.0 3.8 3.7 4.4 4.1  CL 105 106 101 98 99  CO2 25 26 28 24 23   GLUCOSE 189* 166* 117* 276* 92  BUN 24* 19 14 24* 22*  CREATININE 2.06* 2.12* 2.03* 2.30* 2.21*  CALCIUM 8.4* 8.3* 8.2* 7.9* 7.9*  MG  --   --  1.5* 2.0  --   PHOS  --   --  2.6 3.6  --    Liver Function Tests: Recent Labs  Lab 11/18/17 0553 11/21/17 1249 11/22/17 0935 11/23/17 0358  AST 22 17 20 24   ALT 16 14 12 24   ALKPHOS 51 39 38 37*  BILITOT 1.1 0.9 1.0 0.7  PROT 7.2 5.8* 5.8* 5.3*  ALBUMIN 4.1 3.3* 3.5 2.9*   Recent Labs  Lab 11/18/17 0553  LIPASE 48   No results for input(s): AMMONIA in the last 168 hours. CBC: Recent Labs  Lab 11/18/17 0553  11/19/17 1804 11/20/17 0833 11/20/17 1747 11/22/17 0935 11/23/17 0358  WBC 7.2   < > 8.3 7.1 8.5 6.0 9.2  NEUTROABS 4.4  --   --   --   --  3.5 6.6  HGB 13.3   < > 11.3* 11.3* 11.8* 11.4* 10.3*  HCT 39.2   < > 33.8* 35.0* 36.1* 34.9* 31.5*  MCV 87.1   < > 87.3 90.2 89.8 89.7 89.5  PLT 228   < > 213 203 219 192 188   < > = values in this interval not displayed.   Cardiac Enzymes: Recent Labs  Lab 11/18/17 0553  TROPONINI <0.03   BNP: Invalid input(s): POCBNP CBG: Recent Labs  Lab 11/22/17 1654  11/22/17 1939 11/22/17 2243 11/23/17  2863 11/23/17 1154  GLUCAP 168* 282* 290* 215* 85   D-Dimer No results for input(s): DDIMER in the last 72 hours. Hgb A1c No results for input(s): HGBA1C in the last 72 hours. Lipid Profile No results for input(s): CHOL, HDL, LDLCALC, [place], CHOLHDL, LDLDIRECT in the last 72 hours. Thyroid function studies No results for input(s): TSH, T4TOTAL, T3FREE, THYROIDAB in the last 72 hours.  Invalid input(s): FREET3 Anemia work up No results for input(s): VITAMINB12, FOLATE, FERRITIN, TIBC, IRON, RETICCTPCT in the last 72 hours. Urinalysis No results found for: COLORURINE, APPEARANCEUR, [place], [place], [place], [place], [place], [place], PROTEINUR, UROBILINOGEN, NITRITE, LEUKOCYTESUR Sepsis Labs Invalid input(s): PROCALCITONIN,  WBC,  LACTICIDVEN Microbiology Recent Results (from the past 240 hour(s))  Surgical pcr screen     Status: Abnormal   Collection Time: 11/22/17  6:03 AM  Result Value Ref Range Status   MRSA, PCR NEGATIVE NEGATIVE Final   Staphylococcus aureus POSITIVE (A) NEGATIVE Final    Comment: (NOTE) The Xpert SA Assay (FDA approved for NASAL specimens in patients 77 years of age and older), is one component of a comprehensive surveillance program. It is not intended to diagnose infection nor to guide or monitor treatment. Performed at [facility], [place] [address], [place], [place] [address]    Time coordinating discharge: 35 minutes  SIGNED:  Kerney Elbe, DO [facility] 11/23/2017, 1:56 PM Pager is on [place]  If 7PM-7AM, please contact night-coverage [email or web address] Password [email or web address]

## 2017-11-23 NOTE — Progress Notes (Signed)
Inpatient Diabetes Program Recommendations  AACE/ADA: New Consensus Statement on Inpatient Glycemic Control (2015)  Target Ranges:  Prepandial:   less than 140 mg/dL      Peak postprandial:   less than 180 mg/dL (1-2 hours)      Critically ill patients:  140 - 180 mg/dL   Lab Results  Component Value Date   GLUCAP 85 11/23/2017   HGBA1C 8.5 (H) 11/20/2017    Review of Glycemic Control  Spoke with pt regarding his HgbA1C of 8.5%. Pt states the last one was 7.5% in [place]. Pt said he has "plenty of Lantus, Novolog, and test strips for meter" in hotel room where he is staying. Stressed importance of checking blood sugars 3-4x/day and taking his insulin as prescribed. Also instructed pt to make appt with PCP for diabetes management [place] in [place]. (Pt states he will be working locally for next few months.) Care manager to see for obtaining PCP. Pt seems interested in controlling his blood sugars to prevent complications.   Discussed above with MD and RN.  Thank you. Ailene Ardshonda Dawnielle Christiana, RD, LDN, CDE Inpatient Diabetes Coordinator [phone]

## 2017-11-23 NOTE — Progress Notes (Signed)
[facility] Progress Note  1 Day Post-Op  Subjective: CC-  Overall doing well. States that his abdomen is sore around the incision but otherwise doing ok. Denies any current nausea. No emesis over night. Tolerated clear liquids for dinner last night, ordered CM/HH diet for breakfast but has not yet eaten. Ambulated in the halls last night without issues. Last BM 5-6 days ago.  Objective: Vital signs in last 24 hours: Temp:  [98.1 F (36.7 C)-99.3 F (37.4 C)] 98.4 F (36.9 C) (08/29 0539) Pulse Rate:  [95-118] 101 (08/29 0539) Resp:  [13-18] 18 (08/29 0539) BP: (111-161)/(62-108) 151/89 (08/29 0539) SpO2:  [92 %-100 %] 96 % (08/29 0539) Last BM Date: 11/22/17  Intake/Output from previous day: 08/28 0701 - 08/29 0700 In: 5270.8 [P.O.:510; I.V.:2272.3; IV Piggyback:2488.5] Out: 1950 [Urine:1950] Intake/Output this shift: No intake/output data recorded.  PE: Gen:  Alert, NAD, pleasant HEENT: EOM's intact, pupils equal and round Pulm:  effort normal Abd: Soft, ND, appropriately tender, +BS, periumbilical incision with cdi dressing in place Psych: A&Ox3  Skin: no rashes noted, warm and dry  Lab Results:  Recent Labs    11/22/17 0935 11/23/17 0358  WBC 6.0 9.2  HGB 11.4* 10.3*  HCT 34.9* 31.5*  PLT 192 188   BMET Recent Labs    11/22/17 0935 11/23/17 0358  NA 136 130*  K 3.7 4.4  CL 101 98  CO2 28 24  GLUCOSE 117* 276*  BUN 14 24*  CREATININE 2.03* 2.30*  CALCIUM 8.2* 7.9*   PT/INR No results for input(s): LABPROT, INR in the last 72 hours. CMP     Component Value Date/Time   NA 130 (L) 11/23/2017 0358   K 4.4 11/23/2017 0358   CL 98 11/23/2017 0358   CO2 24 11/23/2017 0358   GLUCOSE 276 (H) 11/23/2017 0358   BUN 24 (H) 11/23/2017 0358   CREATININE 2.30 (H) 11/23/2017 0358   CALCIUM 7.9 (L) 11/23/2017 0358   PROT 5.3 (L) 11/23/2017 0358   ALBUMIN 2.9 (L) 11/23/2017 0358   AST 24 11/23/2017 0358   ALT 24 11/23/2017 0358   ALKPHOS 37  (L) 11/23/2017 0358   BILITOT 0.7 11/23/2017 0358   GFRNONAA 32 (L) 11/23/2017 0358   GFRAA 37 (L) 11/23/2017 0358   Lipase     Component Value Date/Time   LIPASE 48 11/18/2017 0553       Studies/Results: Dg Cholangiogram Operative  Result Date: 11/22/2017 CLINICAL DATA:  Cholecystectomy. EXAM: INTRAOPERATIVE CHOLANGIOGRAM TECHNIQUE: Cholangiographic images from the C-arm fluoroscopic device were submitted for interpretation post-operatively. Please see the procedural report for the amount of contrast and the fluoroscopy time utilized. COMPARISON:  Nuclear medicine hepatobiliary study on 11/20/2017 FINDINGS: Intraoperative imaging demonstrates normal opacified biliary tree without evidence of obstruction or filling defect. Contrast enters the duodenum normally. IMPRESSION: Normal intraoperative cholangiogram. Electronically Signed   By: Irish LackGlenn  Yamagata M.D.   On: 11/22/2017 15:00    Anti-infectives: Anti-infectives (From admission, onward)   Start     Dose/Rate Route Frequency Ordered Stop   11/22/17 0600  cefTRIAXone (ROCEPHIN) 2 g in sodium chloride 0.9 % 100 mL IVPB     2 g 200 mL/hr over 30 Minutes Intravenous On call to O.R. 11/21/17 1627 11/22/17 1422       Assessment/Plan HTN DM, uncontrolled- A1c 8.5 Acute on ?chronic kidney disease Chronic constipation Right inguinal hernia- soft and reducible, can f/u outpatient to discuss surgical repair H/o tracheostomy/PEG ~2017 - since removed  Abdominal pain, nausea,  vomiting Biliary dyskinesia Chronic cholecystitis S/p single site laparoscopic cholecystectomy with [facility] 8/28 Dr. Michaell Cowing - POD 1 - tolerating clears, pain well controlled  ID - rocephin x1 8/28 FEN - HH/CM diet VTE - SCDs, ok for chemical DVT prophylaxis from surgical standpoint Foley - none Follow up - [facility], PCP  Plan - HH/CM diet. Add bowel regimen (colace/miralax); recommend continuing this at home for his chronic constipation. If patient is  tolerating diet, ambulating, and pain well controlled he may be discharged from surgical standpoint. Oxycodone rx on chart. Discharge instructions and f/u info on AVS.   LOS: 5 days    Franne Forts , [facility] 11/23/2017, 8:18 AM Pager: [phone] Consults: [phone] Mon 7:00 am -11:30 AM Tues-Fri 7:00 am-4:30 pm Sat-Sun 7:00 am-11:30 am

## 2017-11-23 NOTE — Progress Notes (Signed)
Inpatient Diabetes Program Recommendations  AACE/ADA: New Consensus Statement on Inpatient Glycemic Control (2015)  Target Ranges:  Prepandial:   less than 140 mg/dL      Peak postprandial:   less than 180 mg/dL (1-2 hours)      Critically ill patients:  140 - 180 mg/dL   Lab Results  Component Value Date   GLUCAP 215 (H) 11/23/2017   HGBA1C 8.5 (H) 11/20/2017    Review of Glycemic Control  Diabetes history: DM2 Outpatient Diabetes medications: Lantus 15 units  Current orders for Inpatient glycemic control: Lantus 8 units QHS, Novolog 0-9 units tidwc and hs  HgbA1C - 8.5% - uncontrolled.  Inpatient Diabetes Program Recommendations:     Increase Lantus to home dose of 15 units QHS,  Add Novolog 4 units tidwc for meal coverage insulin if pt eats > 50% meal.  Will speak with pt about HgbA1C of 8.5% and stress importance of f/u with PCP for diabetes management.  Thank you. Thomas Haley, RD, LDN, CDE Inpatient Diabetes Coordinator [phone]

## 2017-11-23 NOTE — Progress Notes (Signed)
Discharge instructions discussed with patient and family, verbalized agreeement and understanding, prescriptions given to patient 

## 2017-11-23 NOTE — Anesthesia Postprocedure Evaluation (Signed)
Anesthesia Post Note  Patient: Thomas SlocumbWellington Haley  Procedure(s) Performed: LAPAROSCOPIC CHOLECYSTECTOMY SINGLE SITE WITH INTRAOPERATIVE CHOLANGIOGRAM (N/A Abdomen)     Patient location during evaluation: PACU Anesthesia Type: General Level of consciousness: sedated and patient cooperative Pain management: pain level controlled Vital Signs Assessment: post-procedure vital signs reviewed and stable Respiratory status: spontaneous breathing Cardiovascular status: stable Anesthetic complications: no    Last Vitals:  Vitals:   11/23/17 0838 11/23/17 0959  BP: (!) 152/80 139/83  Pulse: (!) 106 (!) 110  Resp:  18  Temp:  36.9 C  SpO2:  97%    Last Pain:  Vitals:   11/23/17 0959  TempSrc: Oral  PainSc:                  [name]

## 2017-11-23 NOTE — Progress Notes (Signed)
I have reviewed and concur with this student's documentation.   

## 2017-11-23 NOTE — Care Management (Signed)
This CM spoke with pt at bedside about follow up PCP establishment. This CM used Interpreter service to communicate. [facility] packet was provided to him and their services were explained to him. He was instructed how to call and make a follow up appointment and he states that he will do this. Pt states he has Lantus insulin at home. Sandford Crazeora Elisse Pennick RN,BSN [phone]

## 2019-07-12 IMAGING — CT CT ABD-PELV W/O CM
2 of 4 series · 16 of 46 positions shown, 18 images · non-contrast
Comparison: None.

CLINICAL DATA: Acute epigastric abdominal pain.

EXAM:
CT ABDOMEN AND PELVIS WITHOUT CONTRAST
TECHNIQUE: Multidetector CT imaging of the abdomen and pelvis was performed
following the standard protocol without IV contrast.

[Series 2: axial st · axial · 0.78mm/px · z∈[+587,+992]mm · 13 of 92 slices shown, 15 images]
[im 7/92  soft-tissue]
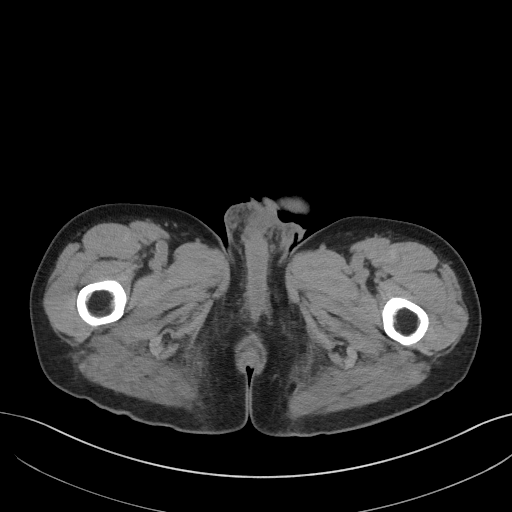
[im 7/92  bone]
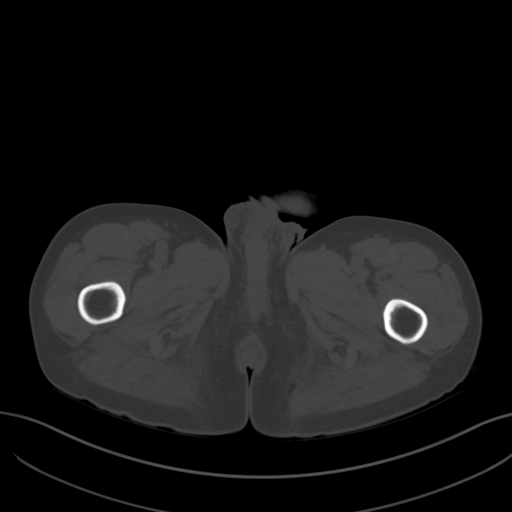
[im 14/92  soft-tissue]
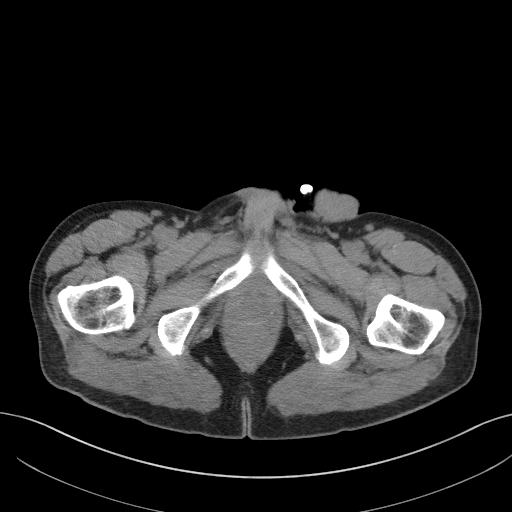
[im 21/92  soft-tissue]
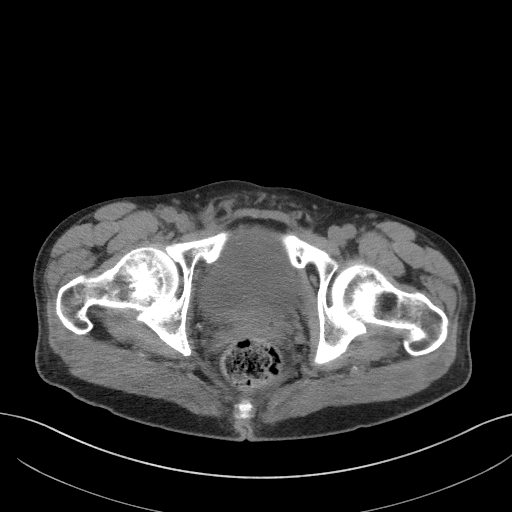
[im 27/92  soft-tissue]
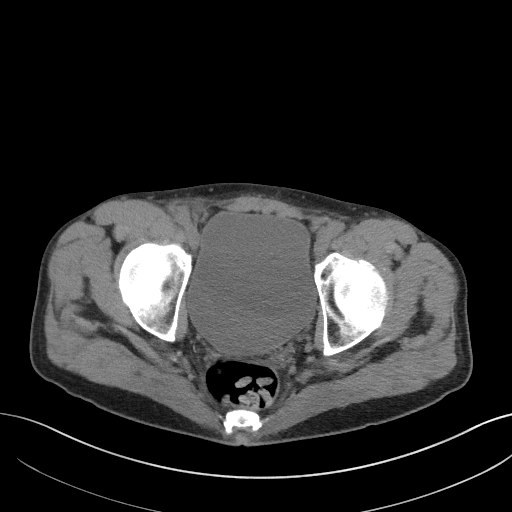
[im 34/92  soft-tissue]
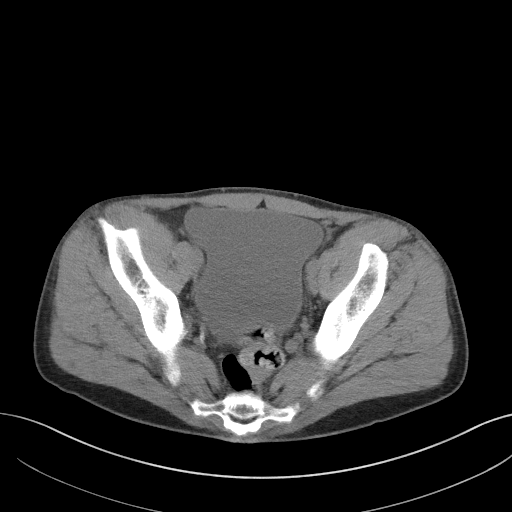
[im 41/92  soft-tissue]
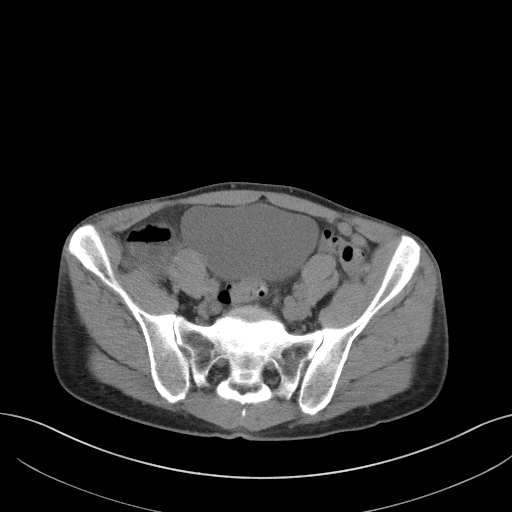
[im 48/92  soft-tissue]
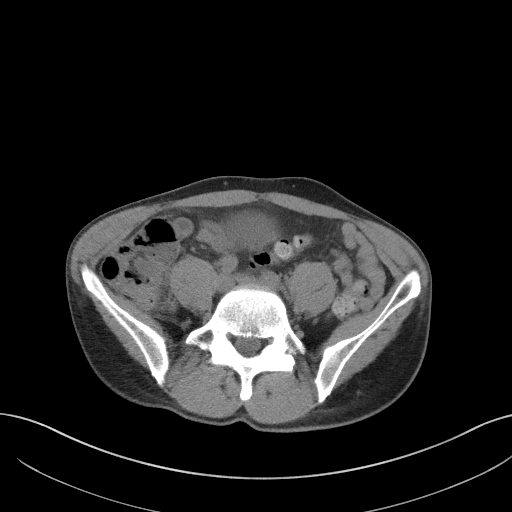
[im 54/92  soft-tissue]
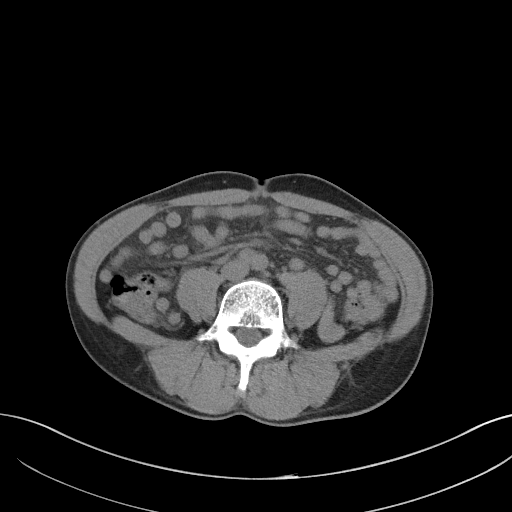
[im 61/92  soft-tissue]
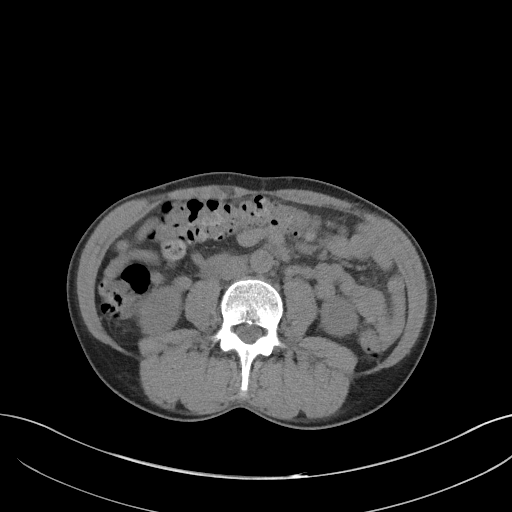
[im 61/92  bone]
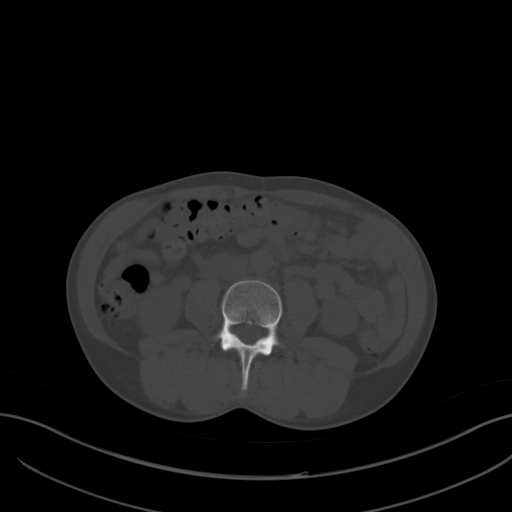
[im 68/92  soft-tissue]
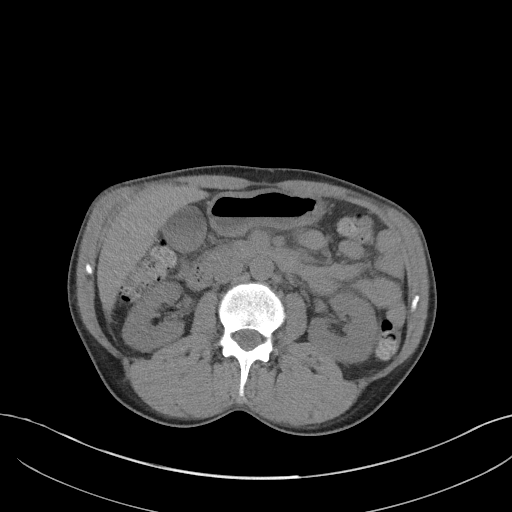
[im 75/92  soft-tissue]
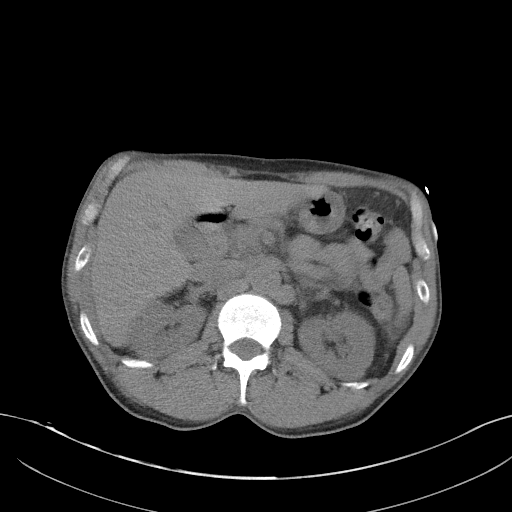
[im 81/92  soft-tissue]
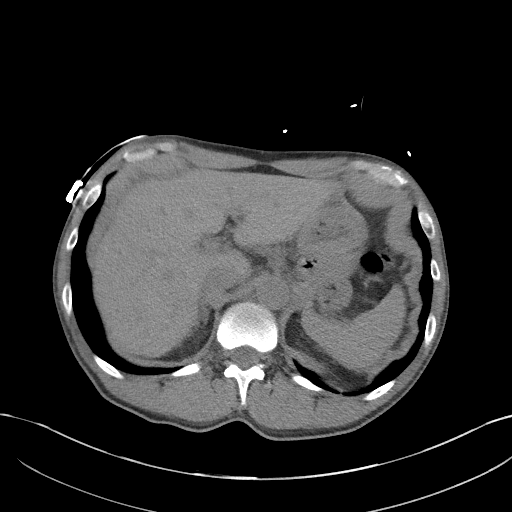
[im 88/92  soft-tissue]
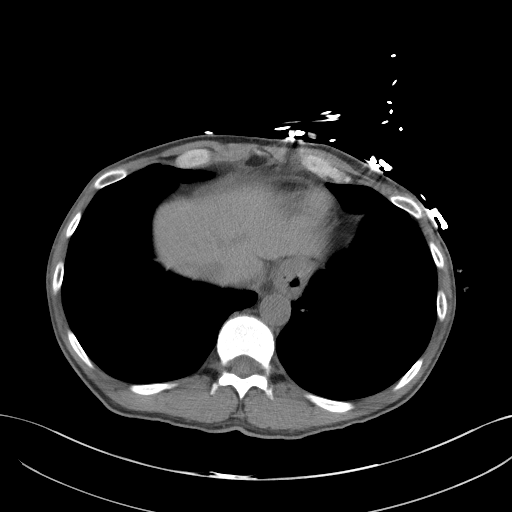

[Series 5: coronal st · coronal · 0.66mm/px · 3 of 80 slices shown]
[im 27/80  soft-tissue]
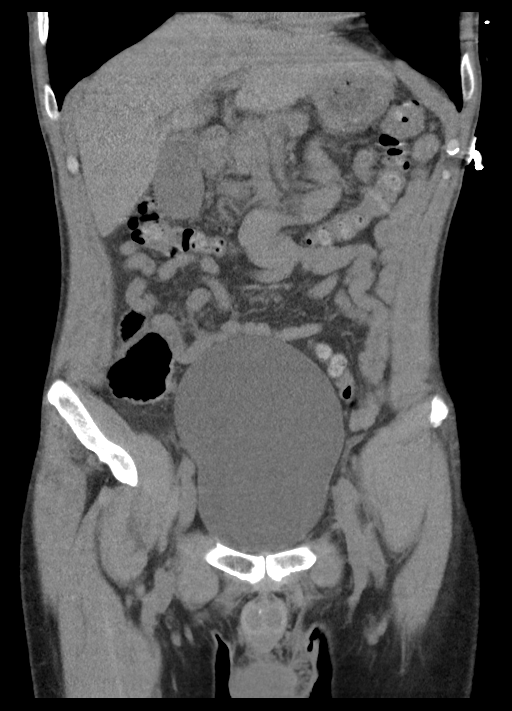
[im 36/80  soft-tissue]
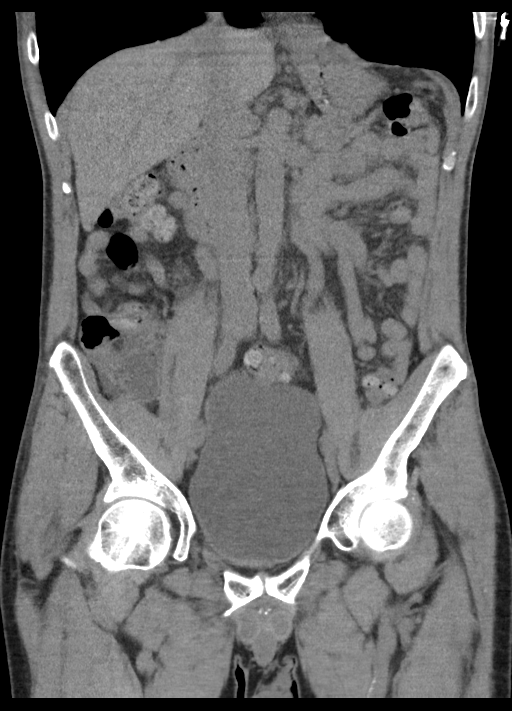
[im 44/80  soft-tissue]
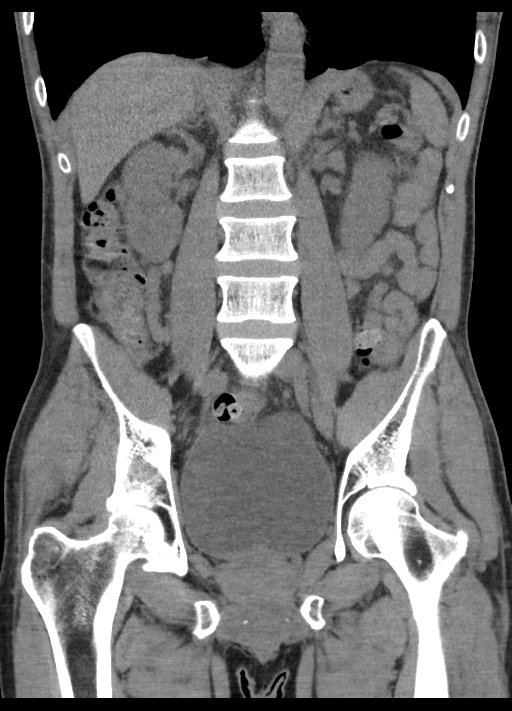

[16 of 46 positions shown; findings below may reference images not displayed]

FINDINGS: Lower chest: No acute abnormality.

Hepatobiliary: No focal liver abnormality is seen. No gallstones,
gallbladder wall thickening, or biliary dilatation.

Pancreas: Unremarkable. No pancreatic ductal dilatation or
surrounding inflammatory changes.

Spleen: Normal in size without focal abnormality.

Adrenals/Urinary Tract: Adrenal glands appear normal. Bilateral
nonobstructive nephrolithiasis is noted. Largest calculus measures 7
mm in lower pole collecting system of right kidney. No
hydronephrosis or renal obstruction is noted. Mild urinary bladder
distention is noted.

Stomach/Bowel: Stomach is within normal limits. Appendix appears
normal. No evidence of bowel wall thickening, distention, or
inflammatory changes. Diverticulosis of descending and sigmoid colon
is noted without inflammation.

Vascular/Lymphatic: No significant vascular findings are present. No
enlarged abdominal or pelvic lymph nodes.

Reproductive: Mild prostatic enlargement is noted.

Other: No abdominal wall hernia or abnormality. No abdominopelvic
ascites.

Musculoskeletal: No acute or significant osseous findings.
IMPRESSION: Bilateral nonobstructive nephrolithiasis. No hydronephrosis or renal
obstruction is noted.

Diverticulosis of descending and sigmoid colon is noted without
inflammation.

Mild prostatic enlargement.

## 2019-07-14 IMAGING — NM NM HEPATO W/GB/PHARM/[PERSON_NAME]
1 series · 6 of 6 positions shown · non-contrast
Comparison: None

CLINICAL DATA: RIGHT upper quadrant pain with nausea and vomiting
for 2 days, chills, question fever, history diabetes mellitus,
hypertension

EXAM:
NUCLEAR MEDICINE HEPATOBILIARY IMAGING WITH GALLBLADDER EF
TECHNIQUE: Sequential images of the abdomen were obtained [DATE] minutes
following intravenous administration of radiopharmaceutical. After
oral ingestion of Ensure, gallbladder ejection fraction was
determined. At 60 min, normal ejection fraction is greater than 33%.
RADIOPHARMACEUTICALS:  5.3 mCi Xc-WWm  Choletec IV

[Series 1: biliary · 3.25mm/px · 6 of 60 frames shown]
[frame 6/60]
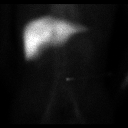
[frame 16/60]
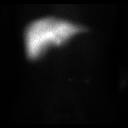
[frame 26/60]
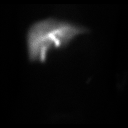
[frame 36/60]
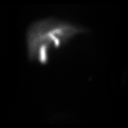
[frame 46/60]
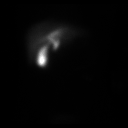
[frame 56/60]
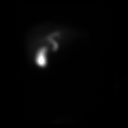

[6 of 6 positions shown; findings below may reference images not displayed]

FINDINGS: Normal tracer extraction from bloodstream indicating normal
hepatocellular function.

Normal excretion of tracer into biliary tree.

Gallbladder visualized at 24 min.

Small bowel not visualized until following fatty meal stimulation

No hepatic retention of tracer.

Subjectively decreased emptying of tracer from gallbladder following
fatty meal stimulation.

Calculated gallbladder ejection fraction is 7%, abnormally low.

Patient reported no symptoms following Ensure ingestion.

Normal gallbladder ejection fraction following Ensure ingestion is
greater than 33% at 1 hour.
IMPRESSION: Patent biliary tree without scintigraphic evidence of acute
cholecystitis.

Abnormal gallbladder response to fatty meal stimulation with a
markedly decreased gallbladder ejection fraction of 7%.

## 2019-07-16 IMAGING — RF DG CHOLANGIOGRAM OPERATIVE
1 series · 4 of 4 positions shown · non-contrast
Comparison: Nuclear medicine hepatobiliary study on 11/20/2017

CLINICAL DATA: Cholecystectomy.

EXAM:
INTRAOPERATIVE CHOLANGIOGRAM
TECHNIQUE: Cholangiographic images from the C-arm fluoroscopic device were
submitted for interpretation post-operatively. Please see the
procedural report for the amount of contrast and the fluoroscopy
time utilized.

[Series 1: run · 4 of 54 frames shown]
[frame 9/54]
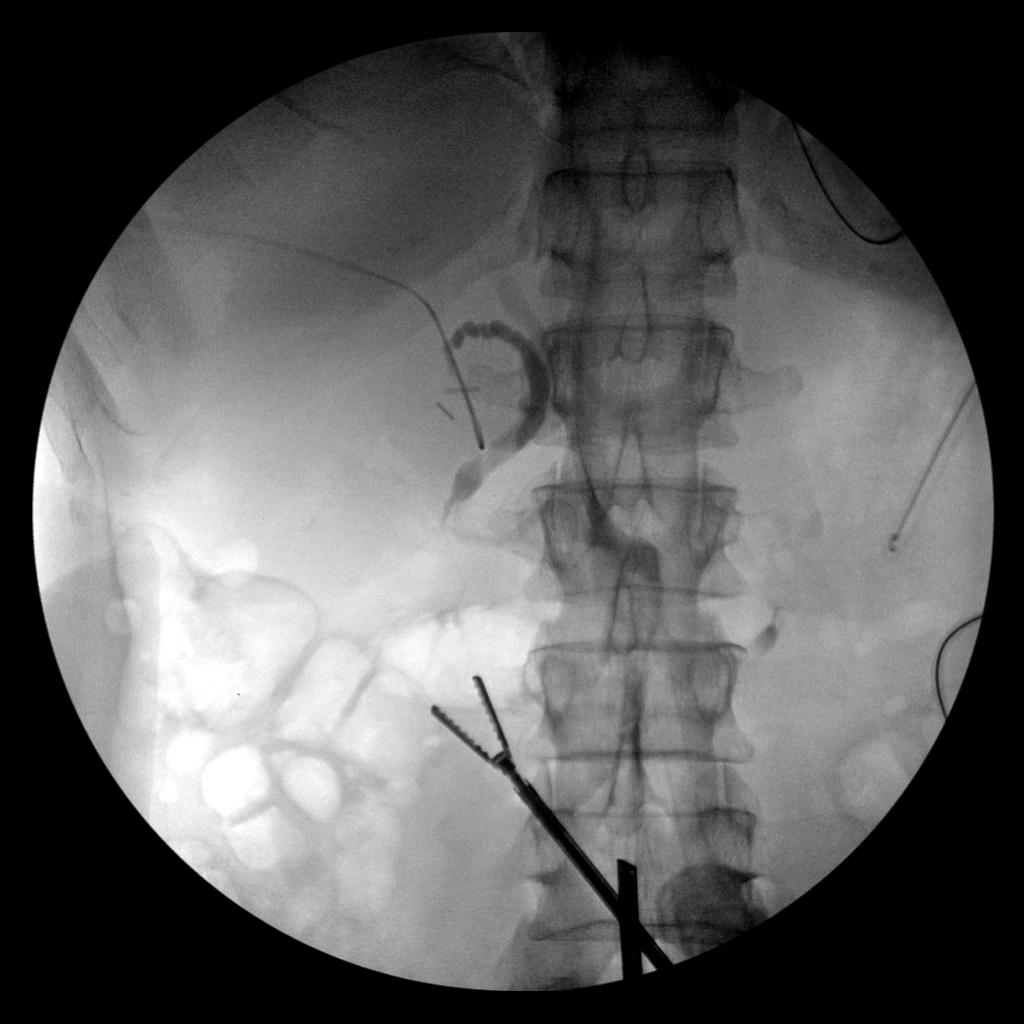
[frame 28/54]
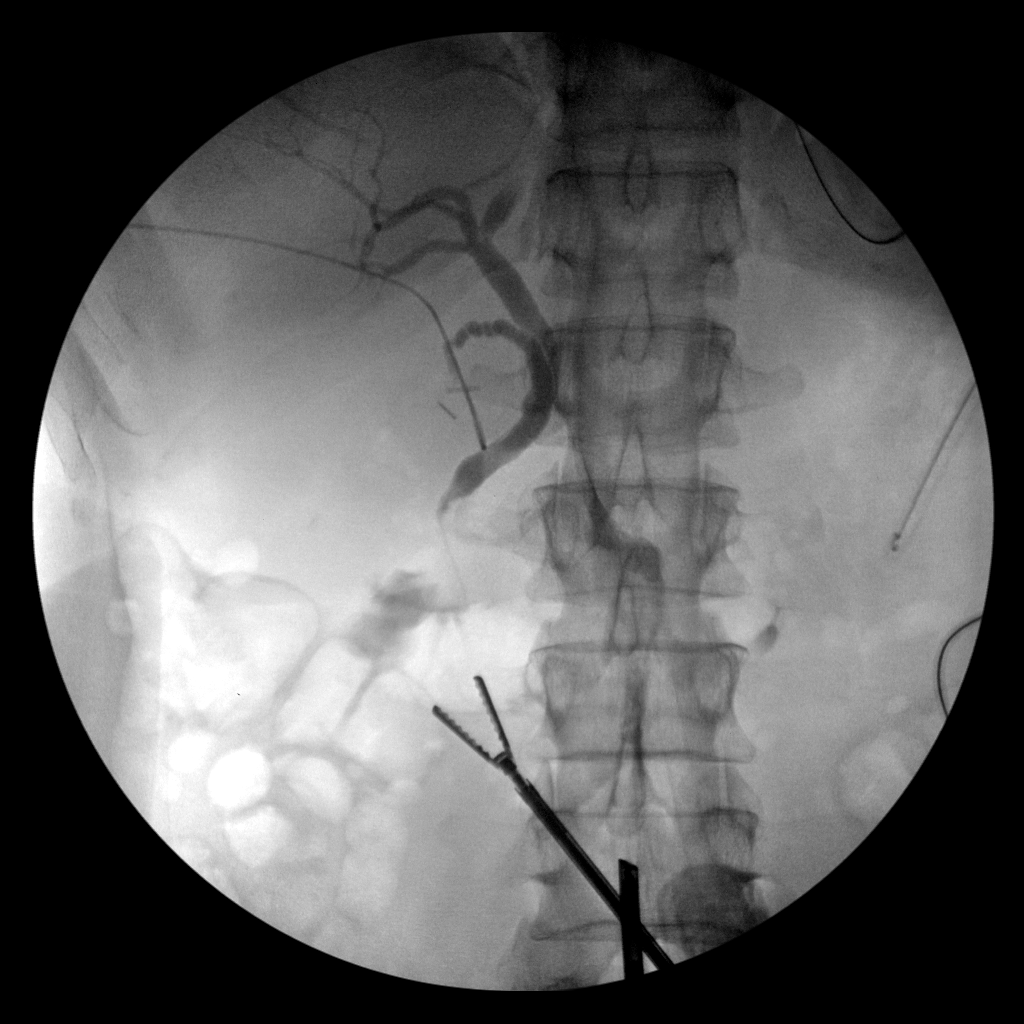
[frame 46/54]
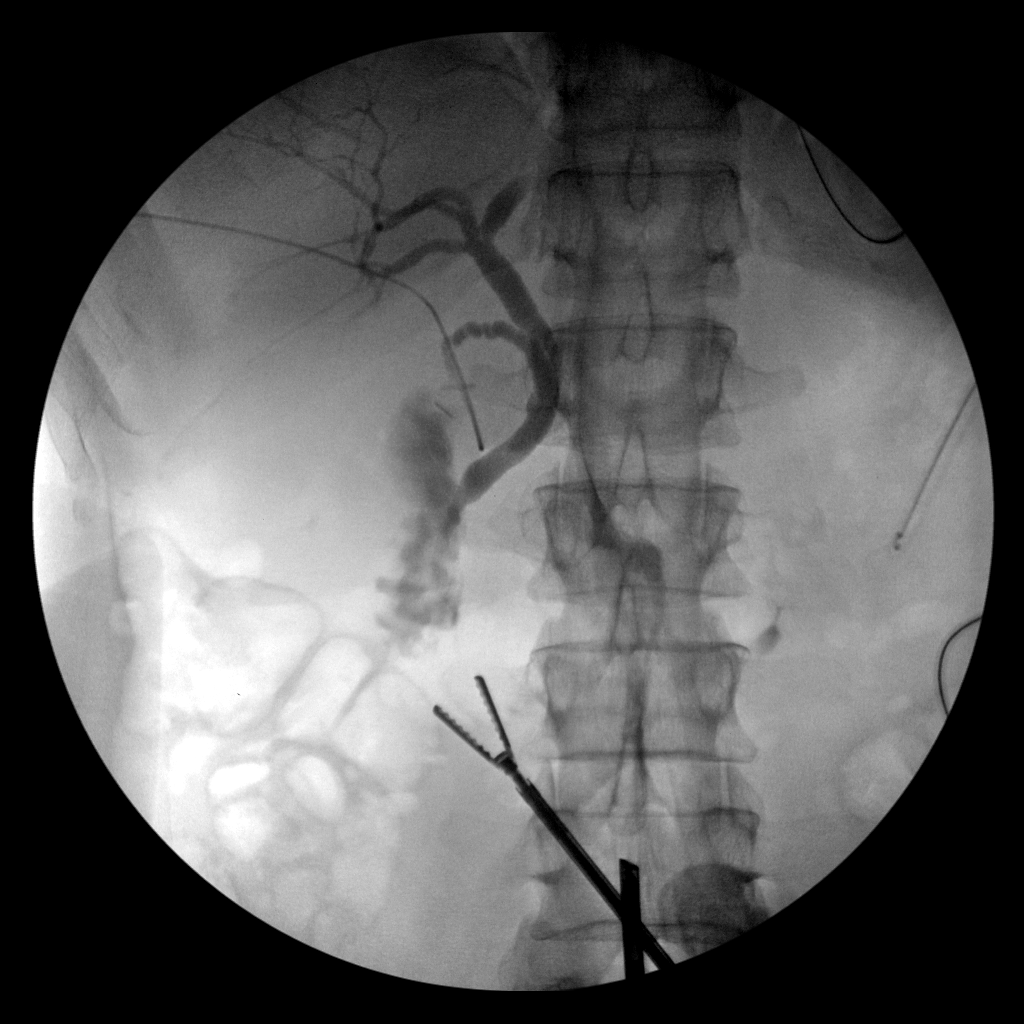
[frame 52/54]
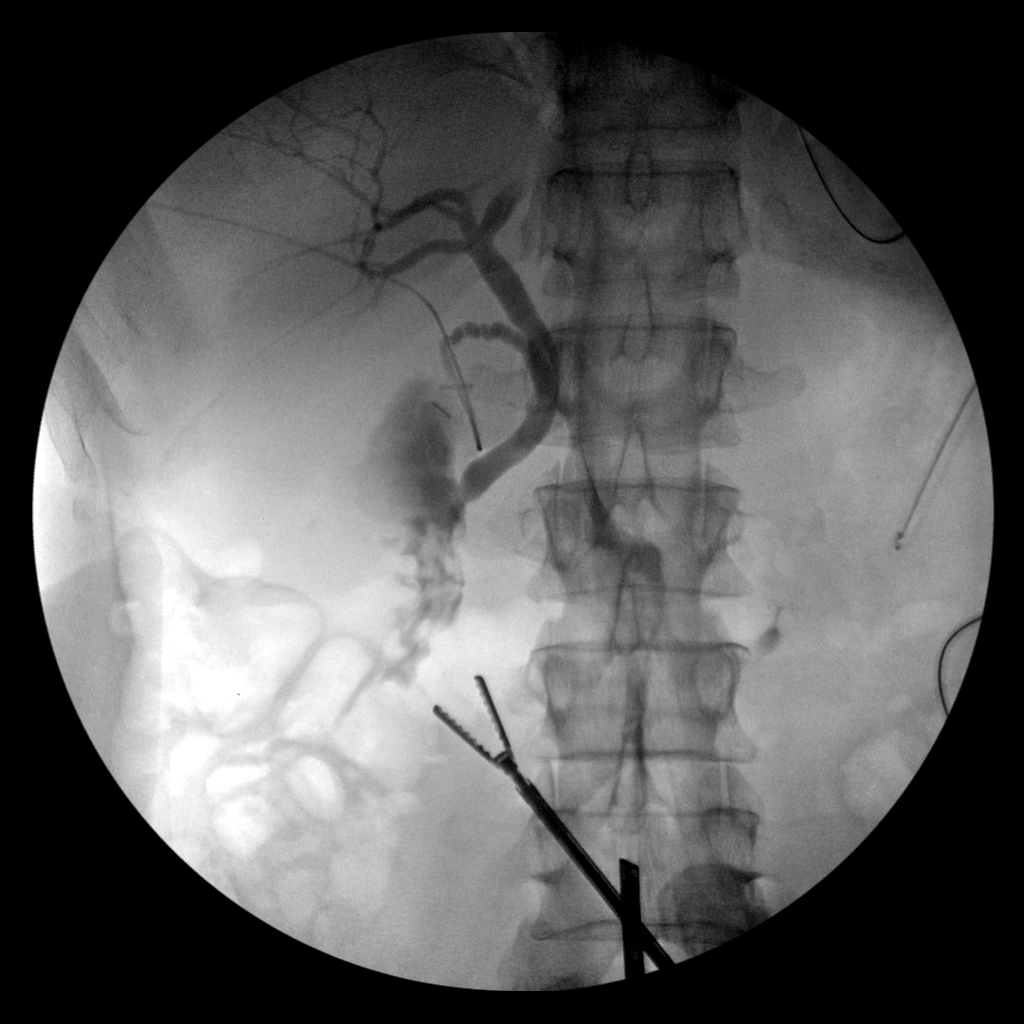

[4 of 4 positions shown; findings below may reference images not displayed]

FINDINGS: Intraoperative imaging demonstrates normal opacified biliary tree
without evidence of obstruction or filling defect. Contrast enters
the duodenum normally.
IMPRESSION: Normal intraoperative cholangiogram.
